# Patient Record
Sex: Female | Born: 1980 | Race: White | Hispanic: No | Marital: Single | Smoking: Current every day smoker
Health system: Southern US, Community
[De-identification: ages and names within clinical notes are randomized; demographics above are authoritative.]

## PROBLEM LIST (undated history)

## (undated) ENCOUNTER — Inpatient Hospital Stay (HOSPITAL_COMMUNITY): Payer: Self-pay

## (undated) DIAGNOSIS — F329 Major depressive disorder, single episode, unspecified: Secondary | ICD-10-CM

## (undated) DIAGNOSIS — Z789 Other specified health status: Secondary | ICD-10-CM

## (undated) DIAGNOSIS — F419 Anxiety disorder, unspecified: Secondary | ICD-10-CM

## (undated) DIAGNOSIS — K219 Gastro-esophageal reflux disease without esophagitis: Secondary | ICD-10-CM

## (undated) HISTORY — DX: Gastro-esophageal reflux disease without esophagitis: K21.9

## (undated) HISTORY — DX: Anxiety disorder, unspecified: F41.9

## (undated) HISTORY — DX: Major depressive disorder, single episode, unspecified: F32.9

## (undated) HISTORY — PX: FRACTURE SURGERY: SHX138

---

## 2000-01-18 ENCOUNTER — Other Ambulatory Visit: Admission: RE | Admit: 2000-01-18 | Discharge: 2000-01-18 | Payer: Self-pay | Admitting: Obstetrics and Gynecology

## 2002-05-25 ENCOUNTER — Emergency Department (HOSPITAL_COMMUNITY): Admission: EM | Admit: 2002-05-25 | Discharge: 2002-05-25 | Payer: Self-pay | Admitting: Emergency Medicine

## 2002-05-25 ENCOUNTER — Encounter: Payer: Self-pay | Admitting: Emergency Medicine

## 2011-11-15 ENCOUNTER — Encounter (HOSPITAL_COMMUNITY): Payer: Self-pay | Admitting: Emergency Medicine

## 2011-11-15 ENCOUNTER — Emergency Department (HOSPITAL_COMMUNITY)
Admission: EM | Admit: 2011-11-15 | Discharge: 2011-11-16 | Disposition: A | Discharge status: Home / Self Care | Payer: Self-pay | Attending: Emergency Medicine | Admitting: Emergency Medicine

## 2011-11-15 DIAGNOSIS — Z9109 Other allergy status, other than to drugs and biological substances: Secondary | ICD-10-CM | POA: Insufficient documentation

## 2011-11-15 DIAGNOSIS — N390 Urinary tract infection, site not specified: Secondary | ICD-10-CM | POA: Insufficient documentation

## 2011-11-15 DIAGNOSIS — F17200 Nicotine dependence, unspecified, uncomplicated: Secondary | ICD-10-CM | POA: Insufficient documentation

## 2011-11-15 DIAGNOSIS — R109 Unspecified abdominal pain: Secondary | ICD-10-CM | POA: Insufficient documentation

## 2011-11-15 NOTE — ED Notes (Signed)
[id number]<AV> Expected date:<BR> Expected time:<BR> Means of arrival:<BR> Comments:<BR> closing

## 2011-11-15 NOTE — ED Notes (Signed)
Pt with right flank pain since Friday which is now radiating to her left flank and right groin.  Pt reports urine is dark and has a strong odor.  Pt reports some nausea today but no vomiting.  Pt feels sweaty at times and is having chills.

## 2011-11-16 LAB — CBC
HCT: 38.8 % (ref 36.0–46.0)
Hemoglobin: 13.6 g/dL (ref 12.0–15.0)
MCH: 33.7 pg (ref 26.0–34.0)
MCHC: 35.1 g/dL (ref 30.0–36.0)
MCV: 96 fL (ref 78.0–100.0)
Platelets: 220 10*3/uL (ref 150–400)
RBC: 4.04 MIL/uL (ref 3.87–5.11)
RDW: 12.6 % (ref 11.5–15.5)
WBC: 12.7 10*3/uL — ABNORMAL HIGH (ref 4.0–10.5)

## 2011-11-16 LAB — URINALYSIS, ROUTINE W REFLEX MICROSCOPIC
Bilirubin Urine: NEGATIVE
Glucose, UA: NEGATIVE mg/dL
Nitrite: POSITIVE — AB
Protein, ur: 30 mg/dL — AB
Specific Gravity, Urine: 1.013 (ref 1.005–1.030)
Urobilinogen, UA: 0.2 mg/dL (ref 0.0–1.0)
pH: 6 (ref 5.0–8.0)

## 2011-11-16 LAB — BASIC METABOLIC PANEL
BUN: 8 mg/dL (ref 6–23)
CO2: 23 mEq/L (ref 19–32)
Calcium: 8.9 mg/dL (ref 8.4–10.5)
Chloride: 99 mEq/L (ref 96–112)
Creatinine, Ser: 0.68 mg/dL (ref 0.50–1.10)
GFR calc Af Amer: >90 mL/min (ref >90)
GFR calc non Af Amer: >90 mL/min (ref >90)
Glucose, Bld: 101 mg/dL — ABNORMAL HIGH (ref 70–99)
Potassium: 3.2 mEq/L — ABNORMAL LOW (ref 3.5–5.1)
Sodium: 133 mEq/L — ABNORMAL LOW (ref 135–145)

## 2011-11-16 LAB — URINE MICROSCOPIC-ADD ON

## 2011-11-16 LAB — PREGNANCY, URINE: Preg Test, Ur: NEGATIVE

## 2011-11-16 MED ORDER — CIPROFLOXACIN HCL 500 MG PO TABS: 500.0000 mg | ORAL_TABLET | Freq: Two times a day (BID) | ORAL | Status: AC

## 2011-11-16 MED ORDER — HYDROMORPHONE HCL PF 1 MG/ML IJ SOLN
1.0000 mg | Freq: Once | INTRAMUSCULAR | Status: AC
  Administered 2011-11-16: 1 mg via INTRAVENOUS
  Filled 2011-11-16: qty 1

## 2011-11-16 MED ORDER — ONDANSETRON HCL 4 MG/2ML IJ SOLN
4.0000 mg | Freq: Once | INTRAMUSCULAR | Status: AC
  Administered 2011-11-16: 4 mg via INTRAVENOUS
  Filled 2011-11-16: qty 2

## 2011-11-16 MED ORDER — POTASSIUM CHLORIDE CRYS ER 20 MEQ PO TBCR
40.0000 meq | EXTENDED_RELEASE_TABLET | Freq: Once | ORAL | Status: AC
  Administered 2011-11-16: 40 meq via ORAL
  Filled 2011-11-16: qty 2

## 2011-11-16 MED ORDER — CIPROFLOXACIN IN D5W 400 MG/200ML IV SOLN
400.0000 mg | Freq: Once | INTRAVENOUS | Status: AC
  Administered 2011-11-16: 400 mg via INTRAVENOUS
  Filled 2011-11-16: qty 200

## 2011-11-16 MED ORDER — SODIUM CHLORIDE 0.9 % IV BOLUS (SEPSIS)
1000.0000 mL | Freq: Once | INTRAVENOUS | Status: AC
  Administered 2011-11-16: 1000 mL via INTRAVENOUS

## 2011-11-16 MED ORDER — KETOROLAC TROMETHAMINE 30 MG/ML IJ SOLN
15.0000 mg | Freq: Once | INTRAMUSCULAR | Status: AC
  Administered 2011-11-16: 15 mg via INTRAVENOUS
  Filled 2011-11-16: qty 1

## 2011-11-16 MED ORDER — OXYCODONE-ACETAMINOPHEN 5-325 MG PO TABS: 1.0000 | ORAL_TABLET | ORAL | Status: AC | PRN

## 2011-11-16 NOTE — ED Notes (Signed)
Antibiotic finished.  MD made aware.

## 2011-11-16 NOTE — ED Notes (Signed)
Kohut, MD at bedside. 

## 2011-11-18 LAB — URINE CULTURE: Colony Count: >=100000

## 2011-11-19 NOTE — ED Notes (Signed)
+   Urine  Patient treated with cipro-sensitive to same-chart sent to EDP to office for review.

## 2011-11-20 NOTE — ED Provider Notes (Signed)
History    31yF with abdominal pain. Lower abdomen initially and now radiates into R flank and groin. Constant at rest and worse with movement. Subjective fever and chills. NO urinary complaints aside from seems darker and has strong odor. No dizziness or lightheadedness. Nausea. No vomiting. No hx of kidney stone or biliary colic.  CSN: [id number]  Arrival date & time 11/15/11  2210   First MD Initiated Contact with Patient 11/15/11 2228      Chief Complaint  Patient presents with  . Flank Pain    (Consider location/radiation/quality/duration/timing/severity/associated sxs/prior treatment) HPI  History reviewed. No pertinent past medical history.  Past Surgical History  Procedure Date  . Fracture surgery     No family history on file.  History  Substance Use Topics  . Smoking status: Current Every Day Smoker  . Smokeless tobacco: Not on file  . Alcohol Use: 3.0 oz/week    5 Cans of beer per week    OB History    Grav Para Term Preterm Abortions TAB SAB Ect Mult Living                  Review of Systems   Review of symptoms negative unless otherwise noted in HPI.   Allergies  Other  Home Medications   Current Outpatient Rx  Name Route Sig Dispense Refill  . ACETAMINOPHEN 325 MG PO TABS Oral Take 650 mg by mouth every 6 (six) hours as needed. For pain    . ACETAMINOPHEN 500 MG PO TABS Oral Take 1,000 mg by mouth every 6 (six) hours as needed. For pain    . CIPROFLOXACIN HCL 500 MG PO TABS Oral Take 1 tablet (500 mg total) by mouth every 12 (twelve) hours. 10 tablet 0  . OXYCODONE-ACETAMINOPHEN 5-325 MG PO TABS Oral Take 1 tablet by mouth every 4 (four) hours as needed for pain. 10 tablet 0    BP 110/82  Pulse 89  Temp 99.7 F (37.6 C) (Oral)  Resp 16  Wt 130 lb (58.968 kg)  SpO2 96%  LMP 11/09/2011  Physical Exam  Nursing note and vitals reviewed. Constitutional: She appears well-developed and well-nourished. No distress.  HENT:  Head:  Normocephalic and atraumatic.  Eyes: Conjunctivae normal are normal. Right eye exhibits no discharge. Left eye exhibits no discharge.  Neck: Neck supple.  Cardiovascular: Normal rate, regular rhythm and normal heart sounds.  Exam reveals no gallop and no friction rub.   No murmur heard. Pulmonary/Chest: Effort normal and breath sounds normal. No respiratory distress.  Abdominal: Soft. She exhibits no distension and no mass. There is tenderness. There is no rebound and no guarding.       Suprapubic tenderness.  Genitourinary:       No cva tenderness.  Musculoskeletal: She exhibits no edema and no tenderness.  Neurological: She is alert.  Skin: Skin is warm and dry.  Psychiatric: She has a normal mood and affect. Her behavior is normal. Thought content normal.    ED Course  Procedures (including critical care time)  Labs Reviewed  URINALYSIS, ROUTINE W REFLEX MICROSCOPIC - Abnormal; Notable for the following:    APPearance CLOUDY (*)     Hgb urine dipstick SMALL (*)     Ketones, ur TRACE (*)     Protein, ur 30 (*)     Nitrite POSITIVE (*)     Leukocytes, UA MODERATE (*)     All other components within normal limits  BASIC METABOLIC PANEL - Abnormal; Notable  for the following:    Sodium 133 (*)     Potassium 3.2 (*)     Glucose, Bld 101 (*)     All other components within normal limits  CBC - Abnormal; Notable for the following:    WBC 12.7 (*)     All other components within normal limits  URINE MICROSCOPIC-ADD ON - Abnormal; Notable for the following:    Squamous Epithelial / LPF FEW (*)     Bacteria, UA MANY (*)     All other components within normal limits  URINE CULTURE  PREGNANCY, URINE  LAB REPORT - SCANNED   No results found.   1. Abdominal pain   2. UTI (lower urinary tract infection)       MDM  31yF with abdominal pain. Likely 2/2 UTI which UA is indicative of. Dose of Abx given in ED. HD stable and handling PO. Feel that further tx can be done as outpt.  Return precautions discussed.        Raeford Razor, MD 11/20/11 [phone]

## 2011-11-28 ENCOUNTER — Encounter (HOSPITAL_COMMUNITY): Payer: Self-pay

## 2011-11-28 ENCOUNTER — Other Ambulatory Visit: Payer: Self-pay | Admitting: Plastic Surgery

## 2011-11-28 ENCOUNTER — Emergency Department (HOSPITAL_COMMUNITY): Payer: Self-pay | Admitting: Anesthesiology

## 2011-11-28 ENCOUNTER — Encounter (HOSPITAL_COMMUNITY): Payer: Self-pay | Admitting: Anesthesiology

## 2011-11-28 ENCOUNTER — Emergency Department (HOSPITAL_COMMUNITY)
Admission: EM | Admit: 2011-11-28 | Discharge: 2011-11-28 | Disposition: A | Discharge status: Home / Self Care | Payer: Self-pay | Attending: Emergency Medicine | Admitting: Emergency Medicine

## 2011-11-28 ENCOUNTER — Encounter (HOSPITAL_COMMUNITY): Admission: EM | Disposition: A | Discharge status: Home / Self Care | Payer: Self-pay | Source: Home / Self Care

## 2011-11-28 DIAGNOSIS — Y998 Other external cause status: Secondary | ICD-10-CM | POA: Insufficient documentation

## 2011-11-28 DIAGNOSIS — S0185XA Open bite of other part of head, initial encounter: Secondary | ICD-10-CM

## 2011-11-28 DIAGNOSIS — W540XXA Bitten by dog, initial encounter: Secondary | ICD-10-CM

## 2011-11-28 DIAGNOSIS — S0181XA Laceration without foreign body of other part of head, initial encounter: Secondary | ICD-10-CM

## 2011-11-28 DIAGNOSIS — S022XXB Fracture of nasal bones, initial encounter for open fracture: Secondary | ICD-10-CM | POA: Insufficient documentation

## 2011-11-28 DIAGNOSIS — T148XXA Other injury of unspecified body region, initial encounter: Secondary | ICD-10-CM

## 2011-11-28 DIAGNOSIS — Y92009 Unspecified place in unspecified non-institutional (private) residence as the place of occurrence of the external cause: Secondary | ICD-10-CM | POA: Insufficient documentation

## 2011-11-28 HISTORY — PX: LACERATION REPAIR: SHX5284

## 2011-11-28 SURGERY — REPAIR, LACERATION, 2 OR MORE
Anesthesia: General | Wound class: Clean Contaminated

## 2011-11-28 MED ORDER — LACTATED RINGERS IV SOLN
INTRAVENOUS | Status: DC | PRN
  Administered 2011-11-28 (×2): via INTRAVENOUS

## 2011-11-28 MED ORDER — HYDROCODONE-ACETAMINOPHEN 5-325 MG PO TABS: 1.0000 | ORAL_TABLET | Freq: Four times a day (QID) | ORAL | Status: DC | PRN

## 2011-11-28 MED ORDER — ONDANSETRON HCL 4 MG/2ML IJ SOLN
4.0000 mg | Freq: Once | INTRAMUSCULAR | Status: AC
  Administered 2011-11-28: 4 mg via INTRAVENOUS

## 2011-11-28 MED ORDER — HYDROMORPHONE HCL PF 1 MG/ML IJ SOLN
0.2500 mg | INTRAMUSCULAR | Status: DC | PRN
  Administered 2011-11-28 (×3): 0.5 mg via INTRAVENOUS

## 2011-11-28 MED ORDER — MIDAZOLAM HCL 5 MG/5ML IJ SOLN
INTRAMUSCULAR | Status: DC | PRN
  Administered 2011-11-28: 2 mg via INTRAVENOUS

## 2011-11-28 MED ORDER — BSS IO SOLN
INTRAOCULAR | Status: AC
  Filled 2011-11-28: qty 15

## 2011-11-28 MED ORDER — ONDANSETRON HCL 4 MG/2ML IJ SOLN
INTRAMUSCULAR | Status: AC
  Filled 2011-11-28: qty 2

## 2011-11-28 MED ORDER — LACTATED RINGERS IV SOLN: INTRAVENOUS | Status: DC

## 2011-11-28 MED ORDER — CEFAZOLIN SODIUM-DEXTROSE 2-3 GM-% IV SOLR
INTRAVENOUS | Status: AC
  Filled 2011-11-28: qty 50

## 2011-11-28 MED ORDER — SODIUM CHLORIDE 0.9 % IR SOLN
Status: DC | PRN
  Administered 2011-11-28: 11:00:00

## 2011-11-28 MED ORDER — PROMETHAZINE HCL 25 MG/ML IJ SOLN
6.2500 mg | INTRAMUSCULAR | Status: DC | PRN
  Administered 2011-11-28: 6.25 mg via INTRAVENOUS

## 2011-11-28 MED ORDER — PROMETHAZINE HCL 25 MG/ML IJ SOLN
INTRAMUSCULAR | Status: AC
  Filled 2011-11-28: qty 1

## 2011-11-28 MED ORDER — MORPHINE SULFATE 4 MG/ML IJ SOLN
4.0000 mg | Freq: Once | INTRAMUSCULAR | Status: AC
  Administered 2011-11-28: 4 mg via INTRAVENOUS
  Filled 2011-11-28: qty 1

## 2011-11-28 MED ORDER — MORPHINE SULFATE 4 MG/ML IJ SOLN
4.0000 mg | Freq: Once | INTRAMUSCULAR | Status: AC
  Administered 2011-11-28: 4 mg via INTRAVENOUS

## 2011-11-28 MED ORDER — CEFAZOLIN SODIUM-DEXTROSE 2-3 GM-% IV SOLR: 2.0000 g | INTRAVENOUS | Status: DC

## 2011-11-28 MED ORDER — DEXAMETHASONE SODIUM PHOSPHATE 4 MG/ML IJ SOLN
INTRAMUSCULAR | Status: DC | PRN
  Administered 2011-11-28: 8 mg via INTRAVENOUS

## 2011-11-28 MED ORDER — OXYMETAZOLINE HCL 0.05 % NA SOLN
NASAL | Status: AC
  Filled 2011-11-28: qty 15

## 2011-11-28 MED ORDER — AMOXICILLIN-POT CLAVULANATE 500-125 MG PO TABS: 1.0000 | ORAL_TABLET | Freq: Three times a day (TID) | ORAL | Status: DC

## 2011-11-28 MED ORDER — HYDROMORPHONE HCL PF 1 MG/ML IJ SOLN
INTRAMUSCULAR | Status: AC
  Filled 2011-11-28: qty 1

## 2011-11-28 MED ORDER — MIDAZOLAM HCL 2 MG/2ML IJ SOLN: 0.5000 mg | Freq: Once | INTRAMUSCULAR | Status: DC | PRN

## 2011-11-28 MED ORDER — HYDROMORPHONE HCL PF 1 MG/ML IJ SOLN
INTRAMUSCULAR | Status: AC
  Administered 2011-11-28: 0.5 mg via INTRAVENOUS
  Filled 2011-11-28: qty 1

## 2011-11-28 MED ORDER — MEPERIDINE HCL 25 MG/ML IJ SOLN: 6.2500 mg | INTRAMUSCULAR | Status: DC | PRN

## 2011-11-28 MED ORDER — FENTANYL CITRATE 0.05 MG/ML IJ SOLN
INTRAMUSCULAR | Status: DC | PRN
  Administered 2011-11-28 (×5): 50 ug via INTRAVENOUS

## 2011-11-28 MED ORDER — PROPOFOL 10 MG/ML IV BOLUS
INTRAVENOUS | Status: DC | PRN
  Administered 2011-11-28: 100 mg via INTRAVENOUS
  Administered 2011-11-28: 200 mg via INTRAVENOUS

## 2011-11-28 MED ORDER — PHENYLEPHRINE HCL 10 MG/ML IJ SOLN
INTRAMUSCULAR | Status: DC | PRN
  Administered 2011-11-28 (×3): 40 ug via INTRAVENOUS

## 2011-11-28 MED ORDER — SODIUM CHLORIDE 0.9 % IV SOLN
3.0000 g | Freq: Once | INTRAVENOUS | Status: AC
  Administered 2011-11-28: 3 g via INTRAVENOUS

## 2011-11-28 MED ORDER — OXYMETAZOLINE HCL 0.05 % NA SOLN
NASAL | Status: DC | PRN
  Administered 2011-11-28: 1 via NASAL

## 2011-11-28 MED ORDER — 0.9 % SODIUM CHLORIDE (POUR BTL) OPTIME
TOPICAL | Status: DC | PRN
  Administered 2011-11-28: 1000 mL

## 2011-11-28 MED ORDER — SUCCINYLCHOLINE CHLORIDE 20 MG/ML IJ SOLN
INTRAMUSCULAR | Status: DC | PRN
  Administered 2011-11-28: 100 mg via INTRAVENOUS

## 2011-11-28 SURGICAL SUPPLY — 55 items
ADH SKN CLS APL DERMABOND .7 (GAUZE/BANDAGES/DRESSINGS) ×2
AIRSTRIP 4 3/4X3 1/4 7185 (GAUZE/BANDAGES/DRESSINGS) IMPLANT
BANDAGE CONFORM 2  STR LF (GAUZE/BANDAGES/DRESSINGS) IMPLANT
BANDAGE GAUZE ELAST BULKY 4 IN (GAUZE/BANDAGES/DRESSINGS) IMPLANT
CANISTER SUCTION 2500CC (MISCELLANEOUS) IMPLANT
CATH ROBINSON RED A/P 16FR (CATHETERS) IMPLANT
CLEANER TIP ELECTROSURG 2X2 (MISCELLANEOUS) ×2 IMPLANT
CLOTH BEACON ORANGE TIMEOUT ST (SAFETY) ×2 IMPLANT
CLSR STERI-STRIP ANTIMIC 1/2X4 (GAUZE/BANDAGES/DRESSINGS) ×1 IMPLANT
CONT SPEC 4OZ CLIKSEAL STRL BL (MISCELLANEOUS) ×1 IMPLANT
COVER SURGICAL LIGHT HANDLE (MISCELLANEOUS) ×2 IMPLANT
DERMABOND ADVANCED (GAUZE/BANDAGES/DRESSINGS) ×2
DERMABOND ADVANCED .7 DNX12 (GAUZE/BANDAGES/DRESSINGS) IMPLANT
DRAIN PENROSE 1/4X12 LTX STRL (WOUND CARE) IMPLANT
DRSG EMULSION OIL 3X3 NADH (GAUZE/BANDAGES/DRESSINGS) IMPLANT
ELECT COATED BLADE 2.86 ST (ELECTRODE) ×1 IMPLANT
ELECT NDL TIP 2.8 STRL (NEEDLE) IMPLANT
ELECT NEEDLE TIP 2.8 STRL (NEEDLE) IMPLANT
ELECT REM PT RETURN 9FT ADLT (ELECTROSURGICAL) ×2
ELECTRODE REM PT RTRN 9FT ADLT (ELECTROSURGICAL) ×1 IMPLANT
GAUZE SPONGE 2X2 8PLY STRL LF (GAUZE/BANDAGES/DRESSINGS) IMPLANT
GAUZE SPONGE 4X4 16PLY XRAY LF (GAUZE/BANDAGES/DRESSINGS) IMPLANT
GLOVE BIO SURGEON STRL SZ 6.5 (GLOVE) ×2 IMPLANT
GOWN STRL NON-REIN LRG LVL3 (GOWN DISPOSABLE) ×4 IMPLANT
KIT BASIN OR (CUSTOM PROCEDURE TRAY) ×2 IMPLANT
KIT ROOM TURNOVER OR (KITS) ×2 IMPLANT
KIT SPLINT NASAL DENVER SM BEI (GAUZE/BANDAGES/DRESSINGS) ×2 IMPLANT
NDL 25GX 5/8IN NON SAFETY (NEEDLE) IMPLANT
NEEDLE 25GX 5/8IN NON SAFETY (NEEDLE) IMPLANT
NS IRRIG 1000ML POUR BTL (IV SOLUTION) ×2 IMPLANT
PAD ARMBOARD 7.5X6 YLW CONV (MISCELLANEOUS) ×4 IMPLANT
PATTIES SURGICAL .5 X1 (DISPOSABLE) ×1 IMPLANT
PENCIL BUTTON HOLSTER BLD 10FT (ELECTRODE) ×2 IMPLANT
SPLINT NASAL DOYLE BI-VL (GAUZE/BANDAGES/DRESSINGS) ×2 IMPLANT
SPONGE GAUZE 2X2 STER 10/PKG (GAUZE/BANDAGES/DRESSINGS) ×1
SPONGE GAUZE 4X4 12PLY (GAUZE/BANDAGES/DRESSINGS) IMPLANT
SUT CHROMIC 4 0 P 3 18 (SUTURE) ×2 IMPLANT
SUT ETHILON 3 0 PS 1 (SUTURE) ×1 IMPLANT
SUT ETHILON 4 0 PS 2 18 (SUTURE) ×2 IMPLANT
SUT ETHILON 5 0 P 3 18 (SUTURE) ×1
SUT MON AB 5-0 P3 18 (SUTURE) ×3 IMPLANT
SUT NYLON ETHILON 5-0 P-3 1X18 (SUTURE) ×1 IMPLANT
SUT SILK 4 0 (SUTURE) ×2
SUT SILK 4-0 18XBRD TIE 12 (SUTURE) ×1 IMPLANT
SUT VIC AB 5-0 P-3 18XBRD (SUTURE) IMPLANT
SUT VIC AB 5-0 P3 18 (SUTURE) ×4
SWAB COLLECTION DEVICE MRSA (MISCELLANEOUS) IMPLANT
SYR BULB IRRIGATION 50ML (SYRINGE) ×1 IMPLANT
SYR TB 1ML LUER SLIP (SYRINGE) IMPLANT
TOWEL OR 17X24 6PK STRL BLUE (TOWEL DISPOSABLE) ×2 IMPLANT
TOWEL OR 17X26 10 PK STRL BLUE (TOWEL DISPOSABLE) ×2 IMPLANT
TRAY ENT MC OR (CUSTOM PROCEDURE TRAY) ×2 IMPLANT
TUBE ANAEROBIC SPECIMEN COL (MISCELLANEOUS) IMPLANT
WATER STERILE IRR 1000ML POUR (IV SOLUTION) ×2 IMPLANT
YANKAUER SUCT BULB TIP NO VENT (SUCTIONS) ×1 IMPLANT

## 2011-11-28 NOTE — Anesthesia Preprocedure Evaluation (Addendum)
Anesthesia Evaluation  Patient identified by MRN, date of birth, ID band Patient awake    Reviewed: Allergy & Precautions, H&P , NPO status , Patient's Chart, lab work & pertinent test results  History of Anesthesia Complications Negative for: history of anesthetic complications  Airway Mallampati: I TM Distance: >3 FB Neck ROM: Full    Dental  (+) Caps, Dental Advisory Given and Teeth Intact,    Pulmonary Current Smoker,  breath sounds clear to auscultation  Pulmonary exam normal       Cardiovascular negative cardio ROS  Rhythm:Regular Rate:Normal     Neuro/Psych negative neurological ROS  negative psych ROS   GI/Hepatic Neg liver ROS, GERD-  Poorly Controlled and Medicated,(+)     substance abuse (4 beers 2:30am)  alcohol use,   Endo/Other  negative endocrine ROS  Renal/GU negative Renal ROS     Musculoskeletal   Abdominal   Peds  Hematology   Anesthesia Other Findings   Reproductive/Obstetrics                         Anesthesia Physical Anesthesia Plan  ASA: II and Emergent  Anesthesia Plan: General   Post-op Pain Management:    Induction: Intravenous, Rapid sequence and Cricoid pressure planned  Airway Management Planned: Oral ETT  Additional Equipment:   Intra-op Plan:   Post-operative Plan: Extubation in OR  Informed Consent: I have reviewed the patients History and Physical, chart, labs and discussed the procedure including the risks, benefits and alternatives for the proposed anesthesia with the patient or authorized representative who has indicated his/her understanding and acceptance.   Dental advisory given  Plan Discussed with: CRNA, Anesthesiologist and Surgeon  Anesthesia Plan Comments: (Plan routine monitors, GETA with RSI)        Anesthesia Quick Evaluation

## 2011-11-28 NOTE — Brief Op Note (Signed)
11/28/2011  11:09 AM  PATIENT:  Meghan [name]  31 y.o. female  PRE-OPERATIVE DIAGNOSIS:  dog bite  POST-OPERATIVE DIAGNOSIS:  Dog Bite  PROCEDURE:  Procedure(s) (LRB) with comments: REPAIR MULTIPLE LACERATIONS (N/A)  SURGEON:  Surgeon(s) and Role:    * Claire Sanger, DO - Primary  PHYSICIAN ASSISTANT:   ASSISTANTS: none   ANESTHESIA:   general  EBL:  Total I/O In: 1000 [I.V.:1000] Out: -   BLOOD ADMINISTERED:none  DRAINS: none   LOCAL MEDICATIONS USED:  NONE  SPECIMEN:  No Specimen  DISPOSITION OF SPECIMEN:  N/A  COUNTS:  YES  TOURNIQUET:  * No tourniquets in log *  DICTATION: dictated  PLAN OF CARE: Discharge to home after PACU  PATIENT DISPOSITION:  PACU - hemodynamically stable.   Delay start of Pharmacological VTE agent (>24hrs) due to surgical blood loss or risk of bleeding: no

## 2011-11-28 NOTE — ED Provider Notes (Signed)
History     CSN: [id number]  Arrival date & time 11/28/11  0316   None     No chief complaint on file.   (Consider location/radiation/quality/duration/timing/severity/associated sxs/prior treatment) Patient is a 31 y.o. female presenting with animal bite. The history is provided by the patient.  Animal Bite  The incident occurred just prior to arrival (PT bit on nose by pitbull, neighbors dog, immune status unknown). The incident occurred at home. There is an injury to the nose. Her tetanus status is UTD. She has been behaving normally. Recently, medical care has been given at this facility.    No past medical history on file.  Past Surgical History  Procedure Date  . Fracture surgery     No family history on file.  History  Substance Use Topics  . Smoking status: Current Every Day Smoker  . Smokeless tobacco: Not on file  . Alcohol Use: 3.0 oz/week    5 Cans of beer per week    OB History    Grav Para Term Preterm Abortions TAB SAB Ect Mult Living                  Review of Systems  Skin: Positive for wound.  All other systems reviewed and are negative.    Allergies  Other  Home Medications   Current Outpatient Rx  Name Route Sig Dispense Refill  . ACETAMINOPHEN 325 MG PO TABS Oral Take 650 mg by mouth every 6 (six) hours as needed. For pain    . ACETAMINOPHEN 500 MG PO TABS Oral Take 1,000 mg by mouth every 6 (six) hours as needed. For pain      LMP 11/09/2011  Physical Exam  Constitutional: She is oriented to person, place, and time. She appears well-developed and well-nourished.  HENT:  Head: Normocephalic.       3x2 cm laceration to bridge of nose with 1cm deficit of tissue  Eyes: Conjunctivae normal and EOM are normal. Pupils are equal, round, and reactive to light.  Neck: Normal range of motion.  Cardiovascular: Normal rate, regular rhythm and normal heart sounds.   Pulmonary/Chest: Effort normal and breath sounds normal.  Abdominal: Soft.  Bowel sounds are normal.  Musculoskeletal: Normal range of motion.  Neurological: She is alert and oriented to person, place, and time.  Skin: Skin is warm and dry.  Psychiatric: She has a normal mood and affect. Her behavior is normal.    ED Course  Procedures (including critical care time)  Labs Reviewed - No data to display No results found.   No diagnosis found.    MDM  Discussed with ent, shoemaker,  In ed to personally evaluate pt.  States needs flap because of tissue loss.  He discussed with [facility].  Awaiting dispo from [facility] vs [facility].        Katriel Cutsforth Lytle Michaels, MD 11/28/11 [phone]

## 2011-11-28 NOTE — Consult Note (Signed)
Reason for Consult:dog bite Referring Physician: Dr. Berniece [name] is an 31 y.o. female.  HPI: The patient is a 31 yrs old wf in the ED for evaluation of a dog bite to her face.  She stated that the incident occurred at 3 am and she was brought directly to the [facility] ED.  ENT was called to evaluate the situation.  It was thought that there was a significant amount of tissue missing on the dorsal aspect of the nose.  She denies any significant medical conditions.  She is a smoker.  No past medical history on file.  Past Surgical History  Procedure Date  . Fracture surgery     No family history on file.  Social History:  reports that she has been smoking Cigarettes.  She has been smoking about 1 pack per day. She does not have any smokeless tobacco history on file. She reports that she drinks alcohol. She reports that she does not use illicit drugs.  Allergies:  Allergies  Allergen Reactions  . Shellfish Allergy Swelling    Facial swelling    Medications: I have reviewed the patient's current medications.  No results found for this or any previous visit (from the past 48 hour(s)).  No results found.  Review of Systems  Constitutional: Negative.   HENT: Negative.   Eyes: Negative.   Respiratory: Negative.   Cardiovascular: Negative.   Gastrointestinal: Negative.   Genitourinary: Negative.   Musculoskeletal: Negative.   Skin: Negative.   Neurological: Negative.   Endo/Heme/Allergies: Negative.   Psychiatric/Behavioral: Negative.    Blood pressure 112/75, pulse 90, temperature 98.6 F (37 C), temperature source Oral, resp. rate 16, weight 58.968 kg (130 lb), last menstrual period 11/09/2011, SpO2 100.00%. Physical Exam  Constitutional: She appears well-developed and well-nourished.  HENT:  Head: Normocephalic.  Nose:    Eyes: Conjunctivae normal and EOM are normal. Pupils are equal, round, and reactive to light.  Neck: Normal range of motion.    Cardiovascular: Normal rate.   Respiratory: Effort normal. No respiratory distress. She has no wheezes.  Neurological: She is alert.  Skin: Skin is warm.  Psychiatric: She has a normal mood and affect. Her behavior is normal. Judgment and thought content normal.    Assessment/Plan: Dog bite to nose. Plan OR for irrigation and debridement with repair of the bite.  Risks and complications were reviewed and include bleeding, pain, scar, and need for additional surgery.  [name],Meghan [name] 11/28/2011, 9:13 AM

## 2011-11-28 NOTE — ED Notes (Signed)
Dr Kelly Splinter at bedside, informed pt that procedure would be at [facility] but unaware of time. Pt remaining NPO.

## 2011-11-28 NOTE — Preoperative (Signed)
Beta Blockers   Reason not to administer Beta Blockers:Not Applicable 

## 2011-11-28 NOTE — ED Notes (Signed)
Spoke with Dr Annalee Genta who stated Dr Kelly Splinter would be here this morning after procedure to assess pt and then plan of care would be established. Pt and bf notified of update

## 2011-11-28 NOTE — Anesthesia Postprocedure Evaluation (Signed)
  Anesthesia Post-op Note  Patient: Meghan [name]  Procedure(s) Performed: Procedure(s) (LRB) with comments: REPAIR MULTIPLE LACERATIONS (N/A)  Patient Location: PACU  Anesthesia Type: General  Level of Consciousness: awake, alert  and patient cooperative  Airway and Oxygen Therapy: Patient Spontanous Breathing  Post-op Pain: none  Post-op Assessment: Post-op Vital signs reviewed, Patient's Cardiovascular Status Stable, Respiratory Function Stable, Patent Airway, No signs of Nausea or vomiting and Pain level controlled  Post-op Vital Signs: Reviewed and stable  Complications: No apparent anesthesia complications

## 2011-11-28 NOTE — ED Notes (Signed)
Pt assisted back in bed from leaving room to smoke with previous RN unaware. Pt still with ETOH on board, with slurred speech and unsteady gait. Pt assisted with removal of clothing and into a gown. Awaiting ENT MD

## 2011-11-28 NOTE — H&P (Signed)
Meghan [name] is an 31 y.o. female.   Chief Complaint: dog bite HPI: The patient is a 3 yrs old wf with a dog bite to her nose.  It occurred at 3 am this morning and she was brought to the [facility] ED for evaluation.  The dog is not known and not in custody.  Her tetanus is up to date (2010).  There is significant abrasion to the dorsal aspect of the nose.   No past medical history on file.  Past Surgical History  Procedure Date  . Fracture surgery     No family history on file. Social History:  reports that she has been smoking Cigarettes.  She has been smoking about 1 pack per day. She does not have any smokeless tobacco history on file. She reports that she drinks alcohol. She reports that she does not use illicit drugs.  Allergies:  Allergies  Allergen Reactions  . Shellfish Allergy Swelling    Facial swelling     (Not in a hospital admission)  No results found for this or any previous visit (from the past 48 hour(s)). No results found.  Review of Systems  Constitutional: Negative.   HENT: Negative.   Eyes: Negative.   Respiratory: Negative.   Cardiovascular: Negative.   Gastrointestinal: Negative.   Genitourinary: Negative.   Musculoskeletal: Negative.   Skin: Negative.   Neurological: Negative.     Last menstrual period 11/09/2011. Physical Exam  Constitutional: She appears well-developed and well-nourished.  HENT:  Nose:    Eyes: Conjunctivae normal and EOM are normal. Pupils are equal, round, and reactive to light.  Cardiovascular: Normal rate.   Respiratory: Effort normal.  GI: Soft.  Musculoskeletal: Normal range of motion.  Neurological: She is alert.  Skin: Skin is warm.  Psychiatric: She has a normal mood and affect. Her behavior is normal. Judgment normal.     Assessment/Plan Dog bite to nose - OR for repair  [facility] 11/28/2011, 9:20 AM

## 2011-11-28 NOTE — Transfer of Care (Signed)
Immediate Anesthesia Transfer of Care Note  Patient: Meghan [name]  Procedure(s) Performed: Procedure(s) (LRB) with comments: REPAIR MULTIPLE LACERATIONS (N/A)  Patient Location: PACU  Anesthesia Type: General  Level of Consciousness: awake, alert  and oriented  Airway & Oxygen Therapy: Patient Spontanous Breathing  Post-op Assessment: Report given to PACU RN and Post -op Vital signs reviewed and stable  Post vital signs: Reviewed and stable  Complications: No apparent anesthesia complications and Patient re-intubated

## 2011-11-28 NOTE — Consult Note (Signed)
ENT CONSULT:  Reason for Consult:Facial Injury Referring Physician: EDPN  Meghan [name] is an 31 y.o. female.  HPI: Pt adm this am for evaluation of facial injury. Reports dog bite to face. Nl vision, no LOC  No past medical history on file.  Past Surgical History  Procedure Date  . Fracture surgery     No family history on file.  Social History:  reports that she has been smoking Cigarettes.  She has been smoking about 1 pack per day. She does not have any smokeless tobacco history on file. She reports that she drinks alcohol. She reports that she does not use illicit drugs.  Allergies:  Allergies  Allergen Reactions  . Shellfish Allergy Swelling    Facial swelling    Medications: I have reviewed the patient's current medications.  No results found for this or any previous visit (from the past 48 hour(s)).  No results found.  ROS:Per ER adm paperwork   Blood pressure 136/98, temperature 98.6 F (37 C), temperature source Oral, weight 58.968 kg (130 lb), last menstrual period 11/09/2011, SpO2 96.00%.  PHYSICAL EXAM: General appearance - oriented to person, place, and time and anxious Eyes - pupils equal and reactive, extraocular eye movements intact, left eye normal, right eye normal Nose - Midline septum and [place] patent, nasal ala intact  2 x 3 cm avulsion injury of nasal dorsum, exposed nasal bones and upper lat cart.  Sig soft tissue loss, no active bleeding Mouth - mucous membranes moist, pharynx normal without lesions  Studies Reviewed:None  Assessment/Plan: Pt with extensive acute avulsion injury to the nasal dorsal soft tissue after dog bite. Pt will require soft tissue reconstruction with possible flap for adequate closure and best cosmesis. Discussed with Dr. Kelly Splinter who will evaluate and treat pt this am.  Meghan [name] 11/28/2011, 8:18 AM

## 2011-11-28 NOTE — ED Notes (Signed)
Pt transported to OR and report given to [employer]

## 2011-11-28 NOTE — ED Notes (Signed)
The incident occurred just prior to arrival (pt bitten on face by pitbull). There is an injury to the nose. There have been no prior injuries to these areas. Her tetanus status is UTD.

## 2011-11-28 NOTE — ED Notes (Signed)
Clothing, purse silver watch and belly button ring given to pt bf Clay.

## 2011-11-28 NOTE — ED Notes (Signed)
Just went to pt's room for hourly rounding. Pt's sister states that pt just went outside to smoke.

## 2011-11-28 NOTE — Progress Notes (Signed)
Pt continues to be very sleepy.  Arousable to loud voice, RR 8-10.  Dr. Jean Rosenthal aware.

## 2011-11-28 NOTE — ED Notes (Signed)
Found pt and bf walking in from outside. Not sure why pt was outside????

## 2011-11-29 ENCOUNTER — Encounter (HOSPITAL_COMMUNITY): Payer: Self-pay | Admitting: Plastic Surgery

## 2011-11-29 NOTE — Addendum Note (Signed)
Addendum  created 11/29/11 1814 by Yonis Carreon J Claudett Bayly, CRNA   Modules edited:Anesthesia Medication Administration    

## 2011-11-29 NOTE — Addendum Note (Signed)
Addendum  created 11/29/11 1814 by Jeani Hawking, CRNA   Modules edited:Anesthesia Medication Administration

## 2011-11-30 NOTE — Op Note (Signed)
NAMERUDELL, MARLOWE NO.:  [id number]  MEDICAL RECORD NO.:  [id number]  LOCATION:  [facility]                         FACILITY:  [facility]  PHYSICIAN:  Wayland Denis, DO      DATE OF BIRTH:  08-15-1980  DATE OF PROCEDURE:  11/28/2011 DATE OF DISCHARGE:  11/28/2011                              OPERATIVE REPORT   PREOPERATIVE DIAGNOSIS:  Dog bite to the nose.  POSTOPERATIVE DIAGNOSIS:  Dog bite to the nose.  PROCEDURE:  Irrigation and debridement of debris and nonviable tissue and nose skin; primary repair of cartilage and skin with internal and external packing.  ATTENDING SURGEON:  Wayland Denis, DO.  ANESTHESIA:  General.  INDICATIONS FOR PROCEDURE:  The patient is a 31 year old female who was allegedly bitten by a dog, a pit bull type, directly prior to coming into the emergency room, which was about 2:33 o'clock in the morning. No more details given.  The owner of the dog is unknown, and the location of the dog is unknown.  Her tetanus is up-to-date.  She had a stellate laceration on the superior dorsal aspect of the nose that included the glabellar area as well.  It was gaping and had jagged edges, it included the soft tissue, bone, and cartilage as well as the inner mucosa.  Risks and complications were reviewed which included bleeding, pain, scar, infection, higher risk with infection with closure, and likely need for repeat operation.  The patient wished to proceed.  Consent was signed.  DESCRIPTION OF PROCEDURE:  The patient was taken to the operating room and placed on the operating room table in a supine position.  General anesthesia was administered.  Once adequate, a time-out was called and all information was confirmed to be correct.  She was prepped and draped in the usual sterile fashion.  The stellate crushed edges of the skin were excised with a #10 blade.  The cartilage and the nasal mucosa was repaired with a combination of chromic and Vicryl  sutures on both sides, the left and the right.  Once that was done, loose bone fragments were removed.  There was a large bone fragment that was placed back over the dorsum and sutures were placed in the soft tissue above it.  The decision was made not to place hardware due to the very, very poor soft tissue envelope.  The skin edges were then reapproximated loosely with a combination of 6-0 Prolene and 5-0 Monocryl. Inner splints were placed and tacked in with a 3-0 silk with a knot on the right.  Steri-Strips and a splint was placed on the nose.  The patient tolerated the procedure well.  There were no complications.  She was awakened and taken to recovery room in stable condition.     Wayland Denis, DO     CS/MEDQ  D:  11/29/2011  T:  11/30/2011  Job:  [id number]

## 2012-08-27 ENCOUNTER — Inpatient Hospital Stay (HOSPITAL_COMMUNITY)
Admission: AD | Admit: 2012-08-27 | Discharge: 2012-08-27 | Disposition: A | Discharge status: Home / Self Care | Payer: Medicaid Other | Source: Ambulatory Visit | Attending: Obstetrics and Gynecology | Admitting: Obstetrics and Gynecology

## 2012-08-27 ENCOUNTER — Encounter (HOSPITAL_COMMUNITY): Payer: Self-pay | Admitting: Advanced Practice Midwife

## 2012-08-27 ENCOUNTER — Inpatient Hospital Stay (HOSPITAL_COMMUNITY): Payer: Medicaid Other

## 2012-08-27 DIAGNOSIS — N930 Postcoital and contact bleeding: Secondary | ICD-10-CM

## 2012-08-27 DIAGNOSIS — R109 Unspecified abdominal pain: Secondary | ICD-10-CM | POA: Insufficient documentation

## 2012-08-27 DIAGNOSIS — O209 Hemorrhage in early pregnancy, unspecified: Secondary | ICD-10-CM | POA: Insufficient documentation

## 2012-08-27 DIAGNOSIS — R51 Headache: Secondary | ICD-10-CM | POA: Insufficient documentation

## 2012-08-27 HISTORY — DX: Other specified health status: Z78.9

## 2012-08-27 LAB — URINALYSIS, ROUTINE W REFLEX MICROSCOPIC
Ketones, ur: NEGATIVE mg/dL
Leukocytes, UA: NEGATIVE
Nitrite: NEGATIVE
Specific Gravity, Urine: 1.02 (ref 1.005–1.030)
pH: 8 (ref 5.0–8.0)

## 2012-08-27 LAB — CBC
HCT: 37.7 % (ref 36.0–46.0)
MCHC: 34.5 g/dL (ref 30.0–36.0)
RDW: 11.9 % (ref 11.5–15.5)

## 2012-08-27 LAB — ABO/RH: ABO/RH(D): A POS

## 2012-08-27 LAB — WET PREP, GENITAL

## 2012-08-27 LAB — HCG, QUANTITATIVE, PREGNANCY: hCG, Beta Chain, Quant, S: 57533 m[IU]/mL — ABNORMAL HIGH (ref <5)

## 2012-08-27 NOTE — Progress Notes (Signed)
Pt states she is feeling some cramping that she rates a 1 and doesn't know if it is because she is hungry.

## 2012-08-27 NOTE — MAU Provider Note (Signed)
Chief Complaint: Vaginal Bleeding   First Provider Initiated Contact with Patient 08/27/12 2240     SUBJECTIVE HPI: Meghan [name] is a 32 y.o. G2P0010 at 100w5d by LMP who presents with + HPT last week, light bleeding and cramping since intercourse last night. Denies fever, chills, passage of tissue, vaginal discharge, urinary complaints, GI complaints. Has not had any testing at this pregnancy. Plans to get prenatal care at [facility].   Past Medical History  Diagnosis Date  . Medical history non-contributory    OB History   Grav Para Term Preterm Abortions TAB SAB Ect Mult Living   2    1          # Outc Date GA Lbr Len/2nd Wgt Sex Del Anes PTL Lv   1 ABT            2 CUR              Past Surgical History  Procedure Laterality Date  . Fracture surgery    . Laceration repair  11/28/2011    Procedure: REPAIR MULTIPLE LACERATIONS;  Surgeon: Wayland Denis, DO;  Location: [facility] OR;  Service: Plastics;  Laterality: N/A;   History   Social History  . Marital Status: Single    Spouse Name: N/A    Number of Children: N/A  . Years of Education: N/A   Occupational History  . Not on file.   Social History Main Topics  . Smoking status: Current Every Day Smoker -- 1.00 packs/day    Types: Cigarettes  . Smokeless tobacco: Not on file  . Alcohol Use: 0.0 oz/week     Comment: occassional  . Drug Use: No  . Sexually Active: Yes   Other Topics Concern  . Not on file   Social History Narrative  . No narrative on file   No current facility-administered medications on file prior to encounter.   No current outpatient prescriptions on file prior to encounter.   Allergies  Allergen Reactions  . Shellfish Allergy Swelling    Facial swelling    ROS: Pertinent items in HPI  OBJECTIVE Blood pressure 114/66, pulse 84, temperature 98.7 F (37.1 C), temperature source Oral, resp. rate 18, height 5\' 7"  (1.702 m), weight 60.328 kg (133 lb), last menstrual period 07/04/2012. GENERAL:  Well-developed, well-nourished female in no acute distress.  HEENT: Normocephalic HEART: normal rate RESP: normal effort ABDOMEN: Soft, non-tender EXTREMITIES: Nontender, no edema NEURO: Alert and oriented SPECULUM EXAM: NEFG, moderate amount of creamy, pink, mildly malodorous discharge. cervix clean BIMANUAL: cervix close; uterus slightly enlarged, no adnexal tenderness or masses. No CMT.  LAB RESULTS Results for orders placed during the hospital encounter of 08/27/12 (from the past 24 hour(s))  URINALYSIS, ROUTINE W REFLEX MICROSCOPIC     Status: None   Collection Time    08/27/12  7:25 PM      Result Value Range   Color, Urine YELLOW  YELLOW   APPearance CLEAR  CLEAR   Specific Gravity, Urine 1.020  1.005 - 1.030   pH 8.0  5.0 - 8.0   Glucose, UA NEGATIVE  NEGATIVE mg/dL   Hgb urine dipstick NEGATIVE  NEGATIVE   Bilirubin Urine NEGATIVE  NEGATIVE   Ketones, ur NEGATIVE  NEGATIVE mg/dL   Protein, ur NEGATIVE  NEGATIVE mg/dL   Urobilinogen, UA 0.2  0.0 - 1.0 mg/dL   Nitrite NEGATIVE  NEGATIVE   Leukocytes, UA NEGATIVE  NEGATIVE  POCT PREGNANCY, URINE     Status: Abnormal  Collection Time    08/27/12  7:54 PM      Result Value Range   Preg Test, Ur POSITIVE (*) NEGATIVE  HCG, QUANTITATIVE, PREGNANCY     Status: Abnormal   Collection Time    08/27/12  9:15 PM      Result Value Range   hCG, Beta Nyra Jabs, [place] [address] (*) <5 mIU/mL  ABO/RH     Status: None   Collection Time    08/27/12  9:15 PM      Result Value Range   ABO/RH(D) A POS    CBC     Status: Abnormal   Collection Time    08/27/12  9:15 PM      Result Value Range   WBC 10.3  4.0 - 10.5 K/uL   RBC 3.85 (*) 3.87 - 5.11 MIL/uL   Hemoglobin 13.0  12.0 - 15.0 g/dL   HCT 91.4  78.2 - 95.6 %   MCV 97.9  78.0 - 100.0 fL   MCH 33.8  26.0 - 34.0 pg   MCHC 34.5  30.0 - 36.0 g/dL   RDW 21.3  08.6 - 57.8 %   Platelets 224  150 - 400 K/uL  WET PREP, GENITAL     Status: Abnormal   Collection Time    08/27/12 10:53  PM      Result Value Range   Yeast Wet Prep HPF POC NONE SEEN  NONE SEEN   Trich, Wet Prep NONE SEEN  NONE SEEN   Clue Cells Wet Prep HPF POC FEW (*) NONE SEEN   WBC, Wet Prep HPF POC FEW (*) NONE SEEN    IMAGING US Ob Comp Less 14 Wks  08/27/2012   *RADIOLOGY REPORT*  Clinical Data: cramping and spotting,  OBSTETRIC <14 WK [place]  Technique: Transabdominal ultrasound examination was performed for complete evaluation of the gestation as well as the maternal uterus, adnexal regions, and pelvic cul-de-sac.  Comparison: None.  Findings: There is a single intrauterine gestation.  Crown-rump length is 1.63 cm for an estimated gestational age of [redacted] weeks 1 day.  Fetal heart rate 174 beats per minute.  No subchorionic hemorrhage.  Ovaries are symmetric in size and echotexture.  No adnexal masses. No free fluid.  IMPRESSION: 8-week-1-day intrauterine pregnancy.  Fetal heart rate 174 beats per minute.   Original Report Authenticated By: Charlett Nose, M.D.    MAU COURSE  ASSESSMENT 1. First trimester bleeding   2. PCB (post coital bleeding)     PLAN Discharge home in stable condition. Pelvic rest. List of providers in pregnancy verification letter given. GC/Chlamydia cultures pending. Follow-up Information   Follow up with [facility].      Follow up with [facility] [place] [facility]. (As needed if symptoms worsen)    Contact information:   [address] [id number] [place] [address] [phone]       Medication List    STOP taking these medications       PAMPRIN MAX 250-250-65 MG per tablet  Generic drug:  aspirin-acetaminophen-caffeine      TAKE these medications       calcium carbonate 500 MG chewable tablet  Commonly known as:  TUMS - dosed in mg elemental calcium  Chew 2 tablets by mouth daily as needed for heartburn.     prenatal multivitamin Tabs  Take 1 tablet by mouth every evening. At [employer],  [place] 08/27/2012  11:31  PM

## 2012-08-27 NOTE — MAU Note (Signed)
Pt took HPT last week and it was positive. Had some bleeding last night after intercourse and still having mild cramping and spotting today.

## 2012-08-27 NOTE — Progress Notes (Signed)
Pt states she has been having"minor headache for the past few days"

## 2012-08-27 NOTE — MAU Note (Signed)
Pt states she started having vaginal bleeding last night and then it stopped and then some spotting "pink"

## 2012-08-28 LAB — GC/CHLAMYDIA PROBE AMP: GC Probe RNA: NEGATIVE

## 2012-08-31 NOTE — MAU Provider Note (Signed)
Attestation of Attending Supervision of Advanced Practitioner: Evaluation and management procedures were performed by the PA/NP/CNM/OB Fellow under my supervision/collaboration. Chart reviewed and agree with management and plan.  Sanam Marmo V 08/31/2012 5:50 PM

## 2012-10-15 ENCOUNTER — Encounter: Payer: Self-pay | Admitting: Obstetrics

## 2012-10-15 ENCOUNTER — Ambulatory Visit (INDEPENDENT_AMBULATORY_CARE_PROVIDER_SITE_OTHER): Payer: Medicaid Other | Admitting: Obstetrics

## 2012-10-15 VITALS — BP 110/68 | Temp 98.7°F | Wt 134.0 lb

## 2012-10-15 DIAGNOSIS — Z3201 Encounter for pregnancy test, result positive: Secondary | ICD-10-CM

## 2012-10-15 DIAGNOSIS — J309 Allergic rhinitis, unspecified: Secondary | ICD-10-CM

## 2012-10-15 DIAGNOSIS — Z3401 Encounter for supervision of normal first pregnancy, first trimester: Secondary | ICD-10-CM

## 2012-10-15 DIAGNOSIS — R51 Headache: Secondary | ICD-10-CM

## 2012-10-15 DIAGNOSIS — K219 Gastro-esophageal reflux disease without esophagitis: Secondary | ICD-10-CM

## 2012-10-15 DIAGNOSIS — J302 Other seasonal allergic rhinitis: Secondary | ICD-10-CM

## 2012-10-15 LAB — OB RESULTS CONSOLE GC/CHLAMYDIA
CHLAMYDIA, DNA PROBE: NEGATIVE
Gonorrhea: NEGATIVE

## 2012-10-15 LAB — POCT URINALYSIS DIPSTICK
Bilirubin, UA: NEGATIVE
Glucose, UA: NEGATIVE
Leukocytes, UA: NEGATIVE
Nitrite, UA: NEGATIVE

## 2012-10-15 MED ORDER — BUTALBITAL-APAP-CAFFEINE 50-325-40 MG PO TABS: 1.0000 | ORAL_TABLET | Freq: Four times a day (QID) | ORAL | Status: DC | PRN

## 2012-10-15 MED ORDER — LORATADINE 10 MG PO TABS: 10.0000 mg | ORAL_TABLET | Freq: Every day | ORAL | Status: DC

## 2012-10-15 MED ORDER — OMEPRAZOLE 20 MG PO CPDR: 20.0000 mg | DELAYED_RELEASE_CAPSULE | Freq: Every day | ORAL | Status: DC

## 2012-10-15 MED ORDER — BUTALBITAL-APAP-CAFFEINE 50-325-40 MG PO TABS: 2.0000 | ORAL_TABLET | Freq: Two times a day (BID) | ORAL | Status: DC | PRN

## 2012-10-15 MED ORDER — OMEPRAZOLE 20 MG PO CPDR: 20.0000 mg | DELAYED_RELEASE_CAPSULE | Freq: Two times a day (BID) | ORAL | Status: DC

## 2012-10-15 NOTE — Progress Notes (Signed)
Pulse-105   Subjective:    Meghan [name] is being seen today for her first obstetrical visit.  This is a planned pregnancy. She is at [redacted]w[redacted]d gestation. Her obstetrical history is significant for smoker. Relationship with FOB: significant other, living together. Patient does intend to breast feed. Pregnancy history fully reviewed.  Menstrual History: OB History   Grav Para Term Preterm Abortions TAB SAB Ect Mult Living   2    1           Menarche age: 32  Patient's last menstrual period was 07/04/2012.    The following portions of the patient's history were reviewed and updated as appropriate: allergies, current medications, past family history, past medical history, past social history, past surgical history and problem list.  Review of Systems Pertinent items are noted in HPI.    Objective:    General appearance: alert and no distress Abdomen: normal findings: soft, non-tender Pelvic: cervix normal in appearance, external genitalia normal, no adnexal masses or tenderness, no cervical motion tenderness, vagina normal without discharge and uterus enlarged, soft and NT. Extremities: extremities normal, atraumatic, no cyanosis or edema    Assessment:    Pregnancy at [redacted]w[redacted]d weeks    Plan:    Initial labs drawn. Prenatal vitamins.  Counseling provided regarding continued use of seat belts, cessation of alcohol consumption, smoking or use of illicit drugs; infection precautions i.e., influenza/TDAP immunizations, toxoplasmosis,CMV, parvovirus, listeria and varicella; workplace safety, exercise during pregnancy; routine dental care, safe medications, sexual activity, hot tubs, saunas, pools, travel, caffeine use, fish and methlymercury, potential toxins, hair treatments, varicose veins Weight gain recommendations reviewed: underweight/BMI< 18.5--> gain 28 - 40 lbs; normal weight/BMI 18.5 - 24.9--> gain 25 - 35 lbs; overweight/BMI 25 - 29.9--> gain 15 - 25 lbs; obese/BMI >30->gain  11  - 20 lbs Problem list reviewed and updated. AFP3 discussed: requested. Role of ultrasound in pregnancy discussed; fetal survey: requested. Amniocentesis discussed: not indicated. Follow up in 2 weeks. 50% of 20 min visit spent on counseling and coordination of care.

## 2012-10-16 ENCOUNTER — Other Ambulatory Visit: Payer: Self-pay | Admitting: *Deleted

## 2012-10-16 DIAGNOSIS — B9689 Other specified bacterial agents as the cause of diseases classified elsewhere: Secondary | ICD-10-CM

## 2012-10-16 DIAGNOSIS — R519 Headache, unspecified: Secondary | ICD-10-CM | POA: Insufficient documentation

## 2012-10-16 DIAGNOSIS — J302 Other seasonal allergic rhinitis: Secondary | ICD-10-CM | POA: Insufficient documentation

## 2012-10-16 DIAGNOSIS — K219 Gastro-esophageal reflux disease without esophagitis: Secondary | ICD-10-CM | POA: Insufficient documentation

## 2012-10-16 LAB — OBSTETRIC PANEL
Antibody Screen: NEGATIVE
HCT: 34.7 % — ABNORMAL LOW (ref 36.0–46.0)
Hemoglobin: 11.7 g/dL — ABNORMAL LOW (ref 12.0–15.0)
Lymphocytes Relative: 21 % (ref 12–46)
Lymphs Abs: 2 10*3/uL (ref 0.7–4.0)
MCHC: 33.7 g/dL (ref 30.0–36.0)
Monocytes Absolute: 0.9 10*3/uL (ref 0.1–1.0)
Monocytes Relative: 10 % (ref 3–12)
Neutro Abs: 6.3 10*3/uL (ref 1.7–7.7)
Rh Type: POSITIVE
Rubella: 2.48 Index — ABNORMAL HIGH (ref <0.90)

## 2012-10-16 LAB — URINE CULTURE
Colony Count: NO GROWTH
Organism ID, Bacteria: NO GROWTH

## 2012-10-16 LAB — GC/CHLAMYDIA PROBE AMP: CT Probe RNA: NEGATIVE

## 2012-10-16 LAB — VITAMIN D 25 HYDROXY (VIT D DEFICIENCY, FRACTURES): Vit D, 25-Hydroxy: 37 ng/mL (ref 30–89)

## 2012-10-16 LAB — WET PREP BY MOLECULAR PROBE
Gardnerella vaginalis: POSITIVE — AB
Trichomonas vaginosis: NEGATIVE

## 2012-10-16 MED ORDER — METRONIDAZOLE 500 MG PO TABS: 500.0000 mg | ORAL_TABLET | Freq: Two times a day (BID) | ORAL | Status: DC

## 2012-10-16 NOTE — Progress Notes (Signed)
Pt aware of results and prescription sent to pt's pharmacy. Pt expressed understanding.

## 2012-10-30 ENCOUNTER — Encounter: Payer: Self-pay | Admitting: Obstetrics & Gynecology

## 2012-10-31 ENCOUNTER — Encounter: Payer: Self-pay | Admitting: Obstetrics & Gynecology

## 2012-10-31 DIAGNOSIS — R87613 High grade squamous intraepithelial lesion on cytologic smear of cervix (HGSIL): Secondary | ICD-10-CM | POA: Insufficient documentation

## 2012-11-06 ENCOUNTER — Ambulatory Visit (INDEPENDENT_AMBULATORY_CARE_PROVIDER_SITE_OTHER): Payer: Medicaid Other | Admitting: Obstetrics

## 2012-11-06 ENCOUNTER — Encounter: Payer: Self-pay | Admitting: Obstetrics

## 2012-11-06 ENCOUNTER — Encounter: Payer: Medicaid Other | Admitting: Obstetrics

## 2012-11-06 VITALS — BP 100/65 | Temp 99.2°F | Wt 140.0 lb

## 2012-11-06 DIAGNOSIS — Z3401 Encounter for supervision of normal first pregnancy, first trimester: Secondary | ICD-10-CM

## 2012-11-06 DIAGNOSIS — F329 Major depressive disorder, single episode, unspecified: Secondary | ICD-10-CM | POA: Insufficient documentation

## 2012-11-06 DIAGNOSIS — Z3482 Encounter for supervision of other normal pregnancy, second trimester: Secondary | ICD-10-CM

## 2012-11-06 DIAGNOSIS — F3289 Other specified depressive episodes: Secondary | ICD-10-CM

## 2012-11-06 DIAGNOSIS — Z369 Encounter for antenatal screening, unspecified: Secondary | ICD-10-CM

## 2012-11-06 DIAGNOSIS — Z3480 Encounter for supervision of other normal pregnancy, unspecified trimester: Secondary | ICD-10-CM

## 2012-11-06 DIAGNOSIS — F17200 Nicotine dependence, unspecified, uncomplicated: Secondary | ICD-10-CM | POA: Insufficient documentation

## 2012-11-06 DIAGNOSIS — Z3400 Encounter for supervision of normal first pregnancy, unspecified trimester: Secondary | ICD-10-CM

## 2012-11-06 DIAGNOSIS — F322 Major depressive disorder, single episode, severe without psychotic features: Secondary | ICD-10-CM | POA: Insufficient documentation

## 2012-11-06 LAB — POCT URINALYSIS DIPSTICK
Blood, UA: NEGATIVE
Spec Grav, UA: <=1.005
Urobilinogen, UA: NEGATIVE

## 2012-11-06 MED ORDER — BUPROPION HCL ER (SR) 150 MG PO TB12: 150.0000 mg | ORAL_TABLET | Freq: Two times a day (BID) | ORAL | Status: DC

## 2012-11-06 NOTE — Progress Notes (Signed)
P 83 Patient reports she has some upper abdominal pain.

## 2012-11-07 ENCOUNTER — Encounter: Payer: Self-pay | Admitting: Obstetrics

## 2012-11-18 ENCOUNTER — Telehealth: Payer: Self-pay | Admitting: *Deleted

## 2012-11-18 NOTE — Telephone Encounter (Signed)
Message copied by Glendell Docker on Tue Nov 18, 2012  7:56 PM ------      Message from: Antionette Char      Created: Fri Oct 31, 2012  8:57 PM       Needs colposcopy ------

## 2012-11-18 NOTE — Telephone Encounter (Signed)
Noted in patients ob sticky note that patient need to be made aware of abnormal papsmear and need for colposcopy after delivery.

## 2012-11-19 ENCOUNTER — Other Ambulatory Visit: Payer: Self-pay | Admitting: Obstetrics

## 2012-11-19 ENCOUNTER — Ambulatory Visit (HOSPITAL_COMMUNITY)
Admission: RE | Admit: 2012-11-19 | Discharge: 2012-11-19 | Disposition: A | Discharge status: Home / Self Care | Payer: Medicaid Other | Source: Ambulatory Visit | Attending: Obstetrics | Admitting: Obstetrics

## 2012-11-19 ENCOUNTER — Ambulatory Visit: Payer: Medicaid Other

## 2012-11-19 DIAGNOSIS — Z369 Encounter for antenatal screening, unspecified: Secondary | ICD-10-CM

## 2012-11-19 DIAGNOSIS — Z3689 Encounter for other specified antenatal screening: Secondary | ICD-10-CM | POA: Insufficient documentation

## 2012-12-04 ENCOUNTER — Ambulatory Visit (INDEPENDENT_AMBULATORY_CARE_PROVIDER_SITE_OTHER): Payer: Medicaid Other | Admitting: Obstetrics

## 2012-12-04 ENCOUNTER — Encounter: Payer: Self-pay | Admitting: Obstetrics

## 2012-12-04 VITALS — BP 118/76 | Temp 98.9°F | Wt 145.0 lb

## 2012-12-04 DIAGNOSIS — Z3402 Encounter for supervision of normal first pregnancy, second trimester: Secondary | ICD-10-CM

## 2012-12-04 DIAGNOSIS — Z3400 Encounter for supervision of normal first pregnancy, unspecified trimester: Secondary | ICD-10-CM

## 2012-12-04 LAB — POCT URINALYSIS DIPSTICK
Glucose, UA: NEGATIVE
Ketones, UA: NEGATIVE
Spec Grav, UA: 1.02

## 2012-12-04 NOTE — Progress Notes (Signed)
P 103 Patient reports she is doing well.

## 2013-01-01 ENCOUNTER — Ambulatory Visit (INDEPENDENT_AMBULATORY_CARE_PROVIDER_SITE_OTHER): Payer: Medicaid Other | Admitting: Obstetrics

## 2013-01-01 ENCOUNTER — Encounter: Payer: Self-pay | Admitting: Obstetrics

## 2013-01-01 ENCOUNTER — Other Ambulatory Visit: Payer: Medicaid Other

## 2013-01-01 VITALS — BP 121/72 | Temp 97.8°F | Wt 150.0 lb

## 2013-01-01 DIAGNOSIS — Z3400 Encounter for supervision of normal first pregnancy, unspecified trimester: Secondary | ICD-10-CM

## 2013-01-01 DIAGNOSIS — Z3480 Encounter for supervision of other normal pregnancy, unspecified trimester: Secondary | ICD-10-CM

## 2013-01-01 DIAGNOSIS — Z3402 Encounter for supervision of normal first pregnancy, second trimester: Secondary | ICD-10-CM

## 2013-01-01 LAB — CBC
HCT: 32.1 % — ABNORMAL LOW (ref 36.0–46.0)
Hemoglobin: 11 g/dL — ABNORMAL LOW (ref 12.0–15.0)
MCH: 32.7 pg (ref 26.0–34.0)
MCHC: 34.3 g/dL (ref 30.0–36.0)
MCV: 95.5 fL (ref 78.0–100.0)
RBC: 3.36 MIL/uL — ABNORMAL LOW (ref 3.87–5.11)
RDW: 13.6 % (ref 11.5–15.5)

## 2013-01-01 LAB — POCT URINALYSIS DIPSTICK
Spec Grav, UA: 1.02
pH, UA: 5

## 2013-01-01 NOTE — Addendum Note (Signed)
Addended by: George Hugh on: 01/01/2013 11:07 AM   Modules accepted: Orders

## 2013-01-01 NOTE — Addendum Note (Signed)
Addended by: George Hugh on: 01/01/2013 10:02 AM   Modules accepted: Orders

## 2013-01-01 NOTE — Progress Notes (Signed)
Pulse- 88 Pt states she gets some pressure in her abdomen when baby balls up.

## 2013-01-15 ENCOUNTER — Ambulatory Visit (INDEPENDENT_AMBULATORY_CARE_PROVIDER_SITE_OTHER): Payer: Medicaid Other | Admitting: Obstetrics & Gynecology

## 2013-01-15 VITALS — BP 119/78 | Temp 97.9°F | Wt 156.0 lb

## 2013-01-15 DIAGNOSIS — Z3400 Encounter for supervision of normal first pregnancy, unspecified trimester: Secondary | ICD-10-CM

## 2013-01-15 DIAGNOSIS — Z3402 Encounter for supervision of normal first pregnancy, second trimester: Secondary | ICD-10-CM

## 2013-01-15 LAB — POCT URINALYSIS DIPSTICK
Bilirubin, UA: NEGATIVE
Blood, UA: NEGATIVE
Glucose, UA: NEGATIVE
Ketones, UA: NEGATIVE
Nitrite, UA: NEGATIVE
Protein, UA: NEGATIVE
Spec Grav, UA: 1.01
Urobilinogen, UA: NEGATIVE
pH, UA: 7

## 2013-01-15 NOTE — Progress Notes (Signed)
Pulse- 97 Pt states she is having some discomfort on left side when baby is  Moving. Pt states she is having pain in her left leg. Pt states she is also having lower back back on the left side.

## 2013-01-15 NOTE — Patient Instructions (Signed)
Pregnancy and Smoking Smoking during pregnancy is very unhealthy for the mother and the developing fetus. The addictive drug in cigarettes (nicotine), carbon monoxide, and many other poisons are inhaled from a cigarette and are carried through your bloodstream to your fetus. Cigarette smoke contains more than 2,500 chemicals. It is not known which of these chemicals are harmful to the developing fetus. However, both nicotine and carbon monoxide play a role in causing health problems in pregnancy. Effects on the fetus of smoking during pregnancy:  Decrease in blood flow and oxygen to the uterus, placenta, and your fetus.  Increased heart rate of the fetus.  Slowing of your fetus's growth in the uterus (intrauterine growth retardation).  Placental problems. Placenta may partially cover or completely cover the opening to the cervix (placenta previa), or the placenta may partially or completely separate from the uterus (placental abruption).  Increase risk of pregnancy outside of the uterus (tubal pregnancy).  Premature rupture membranes, causing the sac that holds the fetus to break too early, resulting in premature birth and increased health risks to the newborn.  Increased risk of birth defects, including heart defects.  Increased risk of miscarriage. Newborns born to women who smoke during pregnancy:  Are more likely to be born too early (prematurely).  Are more likely to be at a low birth weight.  Are at risk for serious health problems, chronic or lifelong disabilities (cerebral palsy, mental retardation, learning problems), and possibly even death  Are at risk of Sudden Infant Death Syndrome (SIDS).  Have higher rates of miscarriage and stillbirth.  Have more lung and breathing (respiratory) problems. Long-term effects on a child's behavior: Some of the following trends are seen with children of smoking mothers:  Increased risk for drug abuse, behavior, and conduct  disorders.  Increased risk for smoking in adolescent girls.  Increased risk for negative behavior in 2-year-olds.  Increase risk for asthma, colic, and childhood obesity, which can lead to diabetes.  Increased risk for finger and toe disorders. Resources to stop smoking during pregnancy:  Counseling.  Psychological treatment.  Acupuncture.  Family intervention.  Hypnosis.  Medicines that are safe to take during pregnancy. Nicotine supplements have not been studied enough. They should only be considered when all other methods fail.  Telephone QUIT lines. Smoking and Breastfeeding:  Nicotine gets passed to the infant through a mother's breastmilk. This can cause nausea, colic, cramping, and diarrhea in the infant.  Smoking may reduce milk supply and interfere with the let-down response.  Even formula-fed infants of mothers who smoke have nicotine and cotinine (nicotine by-product) in their urine. Other resources to help stop smoking:  American Cancer Society: www.cancer.org  American Heart Association: www.americanheart.org  National Cancer Institute: www.cancer.gov  Smoke Free Families: www.smokefreefamilies.[address] [place]): (870) 167-9551 [facility] Document Released: 07/03/2004 Document Revised: 05/14/2011 Document Reviewed: 12/01/2008 [facility] Patient Information 2014 [place], [place].

## 2013-01-27 ENCOUNTER — Ambulatory Visit (INDEPENDENT_AMBULATORY_CARE_PROVIDER_SITE_OTHER): Payer: Medicaid Other | Admitting: Obstetrics

## 2013-01-27 ENCOUNTER — Encounter: Payer: Self-pay | Admitting: Obstetrics

## 2013-01-27 VITALS — BP 124/73 | Temp 98.2°F | Wt 159.0 lb

## 2013-01-27 DIAGNOSIS — K089 Disorder of teeth and supporting structures, unspecified: Secondary | ICD-10-CM

## 2013-01-27 DIAGNOSIS — K0889 Other specified disorders of teeth and supporting structures: Secondary | ICD-10-CM

## 2013-01-27 DIAGNOSIS — Z3403 Encounter for supervision of normal first pregnancy, third trimester: Secondary | ICD-10-CM

## 2013-01-27 DIAGNOSIS — Z3400 Encounter for supervision of normal first pregnancy, unspecified trimester: Secondary | ICD-10-CM

## 2013-01-27 LAB — POCT URINALYSIS DIPSTICK
Glucose, UA: NEGATIVE
Ketones, UA: NEGATIVE
Leukocytes, UA: NEGATIVE
Protein, UA: NEGATIVE
Urobilinogen, UA: NEGATIVE
pH, UA: 5

## 2013-01-27 MED ORDER — OXYCODONE HCL 10 MG PO TABS: ORAL_TABLET | ORAL | Status: DC

## 2013-01-27 NOTE — Progress Notes (Signed)
Pulse 92 Pt states that she is doing well.

## 2013-01-28 ENCOUNTER — Encounter: Payer: Self-pay | Admitting: Obstetrics

## 2013-01-28 DIAGNOSIS — K0889 Other specified disorders of teeth and supporting structures: Secondary | ICD-10-CM | POA: Insufficient documentation

## 2013-02-10 ENCOUNTER — Ambulatory Visit (INDEPENDENT_AMBULATORY_CARE_PROVIDER_SITE_OTHER): Payer: Medicaid Other | Admitting: Obstetrics

## 2013-02-10 ENCOUNTER — Encounter: Payer: Medicaid Other | Admitting: Obstetrics

## 2013-02-10 VITALS — BP 125/77 | Temp 97.5°F | Wt 155.0 lb

## 2013-02-10 DIAGNOSIS — O365931 Maternal care for other known or suspected poor fetal growth, third trimester, fetus 1: Secondary | ICD-10-CM

## 2013-02-10 DIAGNOSIS — Z3400 Encounter for supervision of normal first pregnancy, unspecified trimester: Secondary | ICD-10-CM

## 2013-02-10 DIAGNOSIS — Z3403 Encounter for supervision of normal first pregnancy, third trimester: Secondary | ICD-10-CM

## 2013-02-10 DIAGNOSIS — O365990 Maternal care for other known or suspected poor fetal growth, unspecified trimester, not applicable or unspecified: Secondary | ICD-10-CM

## 2013-02-10 LAB — POCT URINALYSIS DIPSTICK
Bilirubin, UA: NEGATIVE
Ketones, UA: NEGATIVE
Nitrite, UA: NEGATIVE
pH, UA: 5

## 2013-02-10 NOTE — Progress Notes (Signed)
Pulse- 106 Pt states she is having pain in her right side. Pt states she is itching and states it is unbearable.

## 2013-02-11 ENCOUNTER — Encounter: Payer: Self-pay | Admitting: Obstetrics

## 2013-02-12 ENCOUNTER — Ambulatory Visit (HOSPITAL_COMMUNITY)
Admission: RE | Admit: 2013-02-12 | Discharge: 2013-02-12 | Disposition: A | Discharge status: Home / Self Care | Payer: Medicaid Other | Source: Ambulatory Visit | Attending: Obstetrics | Admitting: Obstetrics

## 2013-02-12 DIAGNOSIS — O4400 Placenta previa specified as without hemorrhage, unspecified trimester: Secondary | ICD-10-CM | POA: Insufficient documentation

## 2013-02-12 DIAGNOSIS — O99330 Smoking (tobacco) complicating pregnancy, unspecified trimester: Secondary | ICD-10-CM | POA: Insufficient documentation

## 2013-02-12 DIAGNOSIS — O365990 Maternal care for other known or suspected poor fetal growth, unspecified trimester, not applicable or unspecified: Secondary | ICD-10-CM | POA: Insufficient documentation

## 2013-02-17 ENCOUNTER — Other Ambulatory Visit: Payer: Medicaid Other

## 2013-02-24 ENCOUNTER — Encounter: Payer: Medicaid Other | Admitting: Obstetrics

## 2013-03-04 ENCOUNTER — Ambulatory Visit (INDEPENDENT_AMBULATORY_CARE_PROVIDER_SITE_OTHER): Payer: Medicaid Other | Admitting: Obstetrics

## 2013-03-04 VITALS — BP 123/84 | Temp 98.1°F | Wt 161.0 lb

## 2013-03-04 DIAGNOSIS — Z3403 Encounter for supervision of normal first pregnancy, third trimester: Secondary | ICD-10-CM

## 2013-03-04 DIAGNOSIS — Z3400 Encounter for supervision of normal first pregnancy, unspecified trimester: Secondary | ICD-10-CM

## 2013-03-04 LAB — POCT URINALYSIS DIPSTICK
Blood, UA: NEGATIVE
Leukocytes, UA: NEGATIVE
Protein, UA: NEGATIVE
Spec Grav, UA: 1.015
Urobilinogen, UA: NEGATIVE

## 2013-03-04 NOTE — Progress Notes (Signed)
Pulse 102 Pt states that she been having some contraction that come and go.  Pt states that she is having lower abdominal pressure.

## 2013-03-05 NOTE — L&D Delivery Note (Signed)
Delivery Note At 4:12 PM a viable female was delivered via Vaginal, Spontaneous Delivery (Presentation: LOA ).   Placenta status: Intact, Spontaneous, via Tomasa BlaseSchultz.  Cord: 3 vessels with the following complications: None.    Anesthesia: Epidural  Episiotomy: None Lacerations: 2nd degree, labial Suture Repair: 2.0 3.0 vicryl rapide Est. Blood Loss (mL): 200 ml  Mom to postpartum.  Baby to Couplet care / Skin to Skin.  JACKSON-MOORE,Jelitza Manninen A 04/08/2013, 4:46 PM

## 2013-03-06 ENCOUNTER — Encounter: Payer: Self-pay | Admitting: Obstetrics

## 2013-03-06 LAB — STREP B DNA PROBE: GBSP: NEGATIVE

## 2013-03-11 ENCOUNTER — Ambulatory Visit (INDEPENDENT_AMBULATORY_CARE_PROVIDER_SITE_OTHER): Payer: Medicaid Other | Admitting: Obstetrics

## 2013-03-11 ENCOUNTER — Encounter: Payer: Self-pay | Admitting: Obstetrics

## 2013-03-11 VITALS — BP 128/82 | Temp 96.3°F | Wt 164.0 lb

## 2013-03-11 DIAGNOSIS — Z3403 Encounter for supervision of normal first pregnancy, third trimester: Secondary | ICD-10-CM

## 2013-03-11 DIAGNOSIS — Z3400 Encounter for supervision of normal first pregnancy, unspecified trimester: Secondary | ICD-10-CM

## 2013-03-11 LAB — POCT URINALYSIS DIPSTICK
Blood, UA: NEGATIVE
GLUCOSE UA: NEGATIVE
KETONES UA: NEGATIVE
LEUKOCYTES UA: NEGATIVE
Nitrite, UA: NEGATIVE
PROTEIN UA: NEGATIVE
SPEC GRAV UA: 1.015
pH, UA: 6

## 2013-03-11 NOTE — Progress Notes (Signed)
P 91 Patient had US 3 weeks ago- would like to talk about results.

## 2013-03-18 ENCOUNTER — Ambulatory Visit (INDEPENDENT_AMBULATORY_CARE_PROVIDER_SITE_OTHER): Payer: Medicaid Other | Admitting: Obstetrics

## 2013-03-18 VITALS — BP 126/85 | Temp 98.3°F | Wt 164.0 lb

## 2013-03-18 DIAGNOSIS — Z3480 Encounter for supervision of other normal pregnancy, unspecified trimester: Secondary | ICD-10-CM

## 2013-03-18 LAB — POCT URINALYSIS DIPSTICK
Bilirubin, UA: NEGATIVE
Blood, UA: NEGATIVE
Glucose, UA: NEGATIVE
Ketones, UA: NEGATIVE
LEUKOCYTES UA: NEGATIVE
NITRITE UA: NEGATIVE
PH UA: 6
PROTEIN UA: NEGATIVE
Spec Grav, UA: 1.015
Urobilinogen, UA: NEGATIVE

## 2013-03-18 NOTE — Progress Notes (Signed)
Pulse- 92 Patient states she is having lower abdomen pressure and pain.

## 2013-03-19 ENCOUNTER — Encounter: Payer: Self-pay | Admitting: Obstetrics

## 2013-03-25 ENCOUNTER — Ambulatory Visit (INDEPENDENT_AMBULATORY_CARE_PROVIDER_SITE_OTHER): Payer: Medicaid Other | Admitting: Obstetrics

## 2013-03-25 ENCOUNTER — Encounter: Payer: Self-pay | Admitting: Obstetrics

## 2013-03-25 VITALS — BP 143/88 | Temp 98.3°F | Wt 163.0 lb

## 2013-03-25 DIAGNOSIS — Z3480 Encounter for supervision of other normal pregnancy, unspecified trimester: Secondary | ICD-10-CM

## 2013-03-25 NOTE — Progress Notes (Signed)
Pulse: 104 Patient states she is having lower abdominal pressure. Patient states she is having some contractions but they are irregular.

## 2013-03-28 ENCOUNTER — Other Ambulatory Visit: Payer: Self-pay | Admitting: Obstetrics

## 2013-03-30 NOTE — Telephone Encounter (Signed)
Please Review

## 2013-04-01 ENCOUNTER — Other Ambulatory Visit: Payer: Self-pay | Admitting: Obstetrics

## 2013-04-01 ENCOUNTER — Ambulatory Visit (INDEPENDENT_AMBULATORY_CARE_PROVIDER_SITE_OTHER): Payer: Medicaid Other | Admitting: Obstetrics

## 2013-04-01 ENCOUNTER — Encounter: Payer: Self-pay | Admitting: Obstetrics

## 2013-04-01 VITALS — BP 129/89 | Temp 97.3°F | Wt 164.0 lb

## 2013-04-01 DIAGNOSIS — Z3400 Encounter for supervision of normal first pregnancy, unspecified trimester: Secondary | ICD-10-CM

## 2013-04-01 LAB — POCT URINALYSIS DIPSTICK
Glucose, UA: NEGATIVE
Ketones, UA: NEGATIVE
Leukocytes, UA: NEGATIVE
NITRITE UA: NEGATIVE
PH UA: 6
PROTEIN UA: NEGATIVE
RBC UA: NEGATIVE
Spec Grav, UA: 1.015

## 2013-04-01 NOTE — Progress Notes (Signed)
Pulse: 86

## 2013-04-07 ENCOUNTER — Encounter (HOSPITAL_COMMUNITY): Payer: Self-pay | Admitting: General Practice

## 2013-04-07 ENCOUNTER — Inpatient Hospital Stay (HOSPITAL_COMMUNITY)
Admission: AD | Admit: 2013-04-07 | Discharge: 2013-04-10 | DRG: 775 | Disposition: A | Discharge status: Home / Self Care | Payer: Medicaid Other | Source: Ambulatory Visit | Attending: Obstetrics | Admitting: Obstetrics

## 2013-04-07 ENCOUNTER — Inpatient Hospital Stay (HOSPITAL_COMMUNITY): Payer: Medicaid Other | Admitting: Anesthesiology

## 2013-04-07 ENCOUNTER — Encounter (HOSPITAL_COMMUNITY): Payer: Medicaid Other | Admitting: Anesthesiology

## 2013-04-07 ENCOUNTER — Encounter (HOSPITAL_COMMUNITY): Payer: Self-pay | Admitting: *Deleted

## 2013-04-07 ENCOUNTER — Inpatient Hospital Stay (HOSPITAL_COMMUNITY)
Admission: AD | Admit: 2013-04-07 | Discharge: 2013-04-07 | Disposition: A | Discharge status: Home / Self Care | Payer: Medicaid Other | Source: Ambulatory Visit | Attending: Obstetrics | Admitting: Obstetrics

## 2013-04-07 DIAGNOSIS — O26859 Spotting complicating pregnancy, unspecified trimester: Secondary | ICD-10-CM | POA: Insufficient documentation

## 2013-04-07 DIAGNOSIS — O479 False labor, unspecified: Secondary | ICD-10-CM | POA: Insufficient documentation

## 2013-04-07 DIAGNOSIS — K219 Gastro-esophageal reflux disease without esophagitis: Secondary | ICD-10-CM | POA: Diagnosis present

## 2013-04-07 DIAGNOSIS — O99334 Smoking (tobacco) complicating childbirth: Secondary | ICD-10-CM | POA: Diagnosis present

## 2013-04-07 DIAGNOSIS — IMO0001 Reserved for inherently not codable concepts without codable children: Secondary | ICD-10-CM

## 2013-04-07 DIAGNOSIS — F329 Major depressive disorder, single episode, unspecified: Secondary | ICD-10-CM | POA: Diagnosis present

## 2013-04-07 DIAGNOSIS — O99344 Other mental disorders complicating childbirth: Secondary | ICD-10-CM | POA: Diagnosis present

## 2013-04-07 DIAGNOSIS — F3289 Other specified depressive episodes: Secondary | ICD-10-CM | POA: Diagnosis present

## 2013-04-07 LAB — CBC
HCT: 34.8 % — ABNORMAL LOW (ref 36.0–46.0)
HEMOGLOBIN: 12 g/dL (ref 12.0–15.0)
MCH: 31.9 pg (ref 26.0–34.0)
MCHC: 34.5 g/dL (ref 30.0–36.0)
MCV: 92.6 fL (ref 78.0–100.0)
Platelets: 130 10*3/uL — ABNORMAL LOW (ref 150–400)
RBC: 3.76 MIL/uL — AB (ref 3.87–5.11)
RDW: 14.6 % (ref 11.5–15.5)
WBC: 17.4 10*3/uL — ABNORMAL HIGH (ref 4.0–10.5)

## 2013-04-07 MED ORDER — EPHEDRINE 5 MG/ML INJ
10.0000 mg | INTRAVENOUS | Status: DC | PRN
  Filled 2013-04-07: qty 2

## 2013-04-07 MED ORDER — PHENYLEPHRINE 40 MCG/ML (10ML) SYRINGE FOR IV PUSH (FOR BLOOD PRESSURE SUPPORT)
80.0000 ug | PREFILLED_SYRINGE | INTRAVENOUS | Status: DC | PRN
  Filled 2013-04-07: qty 2
  Filled 2013-04-07: qty 10

## 2013-04-07 MED ORDER — CITRIC ACID-SODIUM CITRATE 334-500 MG/5ML PO SOLN: 30.0000 mL | ORAL | Status: DC | PRN

## 2013-04-07 MED ORDER — LIDOCAINE HCL (PF) 1 % IJ SOLN
30.0000 mL | INTRAMUSCULAR | Status: DC | PRN
  Administered 2013-04-08: 30 mL via SUBCUTANEOUS
  Filled 2013-04-07 (×2): qty 30

## 2013-04-07 MED ORDER — PHENYLEPHRINE 40 MCG/ML (10ML) SYRINGE FOR IV PUSH (FOR BLOOD PRESSURE SUPPORT)
80.0000 ug | PREFILLED_SYRINGE | INTRAVENOUS | Status: DC | PRN
  Filled 2013-04-07: qty 2

## 2013-04-07 MED ORDER — EPHEDRINE 5 MG/ML INJ
10.0000 mg | INTRAVENOUS | Status: DC | PRN
  Filled 2013-04-07: qty 2
  Filled 2013-04-07: qty 4

## 2013-04-07 MED ORDER — ACETAMINOPHEN 325 MG PO TABS: 650.0000 mg | ORAL_TABLET | ORAL | Status: DC | PRN

## 2013-04-07 MED ORDER — OXYTOCIN BOLUS FROM INFUSION: 500.0000 mL | INTRAVENOUS | Status: DC

## 2013-04-07 MED ORDER — DIPHENHYDRAMINE HCL 50 MG/ML IJ SOLN: 12.5000 mg | INTRAMUSCULAR | Status: DC | PRN

## 2013-04-07 MED ORDER — LACTATED RINGERS IV SOLN
500.0000 mL | Freq: Once | INTRAVENOUS | Status: AC
  Administered 2013-04-08: 500 mL via INTRAVENOUS

## 2013-04-07 MED ORDER — LACTATED RINGERS IV SOLN
INTRAVENOUS | Status: DC
  Administered 2013-04-07 – 2013-04-08 (×2): via INTRAVENOUS

## 2013-04-07 MED ORDER — FLEET ENEMA 7-19 GM/118ML RE ENEM: 1.0000 | ENEMA | RECTAL | Status: DC | PRN

## 2013-04-07 MED ORDER — ONDANSETRON HCL 4 MG/2ML IJ SOLN: 4.0000 mg | Freq: Four times a day (QID) | INTRAMUSCULAR | Status: DC | PRN

## 2013-04-07 MED ORDER — LACTATED RINGERS IV SOLN
500.0000 mL | INTRAVENOUS | Status: DC | PRN
Start: 2013-04-07 — End: 2013-04-08

## 2013-04-07 MED ORDER — OXYCODONE-ACETAMINOPHEN 5-325 MG PO TABS
2.0000 | ORAL_TABLET | Freq: Once | ORAL | Status: AC
  Administered 2013-04-07: 2 via ORAL
  Filled 2013-04-07: qty 2

## 2013-04-07 MED ORDER — IBUPROFEN 600 MG PO TABS: 600.0000 mg | ORAL_TABLET | Freq: Four times a day (QID) | ORAL | Status: DC | PRN

## 2013-04-07 MED ORDER — BUTORPHANOL TARTRATE 1 MG/ML IJ SOLN: 1.0000 mg | INTRAMUSCULAR | Status: DC | PRN

## 2013-04-07 MED ORDER — OXYTOCIN 40 UNITS IN LACTATED RINGERS INFUSION - SIMPLE MED: 62.5000 mL/h | INTRAVENOUS | Status: DC

## 2013-04-07 MED ORDER — OXYCODONE-ACETAMINOPHEN 5-325 MG PO TABS: 1.0000 | ORAL_TABLET | ORAL | Status: DC | PRN

## 2013-04-07 MED ORDER — FENTANYL 2.5 MCG/ML BUPIVACAINE 1/10 % EPIDURAL INFUSION (WH - ANES)
14.0000 mL/h | INTRAMUSCULAR | Status: DC | PRN
  Administered 2013-04-08: 14 mL/h via EPIDURAL
  Filled 2013-04-07 (×3): qty 125

## 2013-04-07 NOTE — Anesthesia Preprocedure Evaluation (Signed)
Anesthesia Evaluation    Airway Mallampati: II TM Distance: >3 FB Neck ROM: Full    Dental no notable dental hx. (+) Poor Dentition and Teeth Intact   Pulmonary Current Smoker,  breath sounds clear to auscultation  Pulmonary exam normal       Cardiovascular negative cardio ROS  Rhythm:Regular Rate:Normal     Neuro/Psych  Headaches, PSYCHIATRIC DISORDERS Depression    GI/Hepatic Neg liver ROS, GERD-  Medicated and Controlled,  Endo/Other  negative endocrine ROS  Renal/GU negative Renal ROS  negative genitourinary   Musculoskeletal negative musculoskeletal ROS (+)   Abdominal (+) - obese,   Peds  Hematology Thrombocytopenia- probably gestational   Anesthesia Other Findings   Reproductive/Obstetrics (+) Pregnancy                           Anesthesia Physical Anesthesia Plan  ASA: II  Anesthesia Plan:    Post-op Pain Management:    Induction:   Airway Management Planned: Natural Airway  Additional Equipment:   Intra-op Plan:   Post-operative Plan:   Informed Consent: I have reviewed the patients History and Physical, chart, labs and discussed the procedure including the risks, benefits and alternatives for the proposed anesthesia with the patient or authorized representative who has indicated his/her understanding and acceptance.     Plan Discussed with: Anesthesiologist  Anesthesia Plan Comments:         Anesthesia Quick Evaluation

## 2013-04-07 NOTE — MAU Note (Signed)
Patient states she is having contractions about every 5-7 minutes. Has had some spotting. Reports good fetal movement, no leaking. S/S of flu denies.

## 2013-04-07 NOTE — MAU Note (Signed)
Pt states she has been having contractions since 2am

## 2013-04-07 NOTE — Discharge Instructions (Signed)

## 2013-04-08 ENCOUNTER — Encounter (HOSPITAL_COMMUNITY): Payer: Self-pay | Admitting: *Deleted

## 2013-04-08 ENCOUNTER — Encounter: Payer: Medicaid Other | Admitting: Obstetrics

## 2013-04-08 LAB — RPR: RPR Ser Ql: NONREACTIVE

## 2013-04-08 MED ORDER — IBUPROFEN 600 MG PO TABS
600.0000 mg | ORAL_TABLET | Freq: Four times a day (QID) | ORAL | Status: DC
  Administered 2013-04-08 – 2013-04-10 (×7): 600 mg via ORAL
  Filled 2013-04-08 (×7): qty 1

## 2013-04-08 MED ORDER — LIDOCAINE HCL (PF) 1 % IJ SOLN
INTRAMUSCULAR | Status: DC | PRN
  Administered 2013-04-08 (×2): 4 mL

## 2013-04-08 MED ORDER — MEASLES, MUMPS & RUBELLA VAC ~~LOC~~ INJ
0.5000 mL | INJECTION | Freq: Once | SUBCUTANEOUS | Status: DC
  Filled 2013-04-08: qty 0.5

## 2013-04-08 MED ORDER — SENNOSIDES-DOCUSATE SODIUM 8.6-50 MG PO TABS
2.0000 | ORAL_TABLET | ORAL | Status: DC
  Administered 2013-04-08 – 2013-04-10 (×2): 2 via ORAL
  Filled 2013-04-08 (×2): qty 2

## 2013-04-08 MED ORDER — TERBUTALINE SULFATE 1 MG/ML IJ SOLN: 0.2500 mg | Freq: Once | INTRAMUSCULAR | Status: DC | PRN

## 2013-04-08 MED ORDER — WITCH HAZEL-GLYCERIN EX PADS: 1.0000 "application " | MEDICATED_PAD | CUTANEOUS | Status: DC | PRN

## 2013-04-08 MED ORDER — OXYTOCIN 40 UNITS IN LACTATED RINGERS INFUSION - SIMPLE MED
1.0000 m[IU]/min | INTRAVENOUS | Status: DC
  Administered 2013-04-08: 2 m[IU]/min via INTRAVENOUS
  Filled 2013-04-08: qty 1000

## 2013-04-08 MED ORDER — OXYCODONE-ACETAMINOPHEN 5-325 MG PO TABS
1.0000 | ORAL_TABLET | ORAL | Status: DC | PRN
  Administered 2013-04-09 – 2013-04-10 (×5): 1 via ORAL
  Filled 2013-04-08 (×5): qty 1

## 2013-04-08 MED ORDER — TETANUS-DIPHTH-ACELL PERTUSSIS 5-2.5-18.5 LF-MCG/0.5 IM SUSP
0.5000 mL | Freq: Once | INTRAMUSCULAR | Status: AC
  Administered 2013-04-10: 0.5 mL via INTRAMUSCULAR

## 2013-04-08 MED ORDER — FERROUS SULFATE 325 (65 FE) MG PO TABS
325.0000 mg | ORAL_TABLET | Freq: Two times a day (BID) | ORAL | Status: DC
  Administered 2013-04-09 – 2013-04-10 (×3): 325 mg via ORAL
  Filled 2013-04-08 (×3): qty 1

## 2013-04-08 MED ORDER — MAGNESIUM HYDROXIDE 400 MG/5ML PO SUSP: 30.0000 mL | ORAL | Status: DC | PRN

## 2013-04-08 MED ORDER — DIPHENHYDRAMINE HCL 25 MG PO CAPS: 25.0000 mg | ORAL_CAPSULE | Freq: Four times a day (QID) | ORAL | Status: DC | PRN

## 2013-04-08 MED ORDER — ONDANSETRON HCL 4 MG/2ML IJ SOLN: 4.0000 mg | INTRAMUSCULAR | Status: DC | PRN

## 2013-04-08 MED ORDER — PRENATAL MULTIVITAMIN CH
1.0000 | ORAL_TABLET | Freq: Every day | ORAL | Status: DC
  Administered 2013-04-09: 1 via ORAL
  Filled 2013-04-08: qty 1

## 2013-04-08 MED ORDER — BUPROPION HCL ER (SR) 150 MG PO TB12
150.0000 mg | ORAL_TABLET | Freq: Two times a day (BID) | ORAL | Status: DC
  Administered 2013-04-09 – 2013-04-10 (×4): 150 mg via ORAL
  Filled 2013-04-08 (×6): qty 1

## 2013-04-08 MED ORDER — LANOLIN HYDROUS EX OINT: TOPICAL_OINTMENT | CUTANEOUS | Status: DC | PRN

## 2013-04-08 MED ORDER — DIBUCAINE 1 % RE OINT: 1.0000 "application " | TOPICAL_OINTMENT | RECTAL | Status: DC | PRN

## 2013-04-08 MED ORDER — BENZOCAINE-MENTHOL 20-0.5 % EX AERO
1.0000 "application " | INHALATION_SPRAY | CUTANEOUS | Status: DC | PRN
  Administered 2013-04-08: 1 via TOPICAL
  Filled 2013-04-08: qty 56

## 2013-04-08 MED ORDER — ONDANSETRON HCL 4 MG PO TABS: 4.0000 mg | ORAL_TABLET | ORAL | Status: DC | PRN

## 2013-04-08 MED ORDER — FENTANYL 2.5 MCG/ML BUPIVACAINE 1/10 % EPIDURAL INFUSION (WH - ANES)
INTRAMUSCULAR | Status: DC | PRN
  Administered 2013-04-08: 14 mL/h via EPIDURAL

## 2013-04-08 MED ORDER — ZOLPIDEM TARTRATE 5 MG PO TABS: 5.0000 mg | ORAL_TABLET | Freq: Every evening | ORAL | Status: DC | PRN

## 2013-04-08 NOTE — H&P (Signed)
Meghan [name] is a 33 y.o. female presenting for contractions. Maternal Medical History:  Reason for admission: Contractions.   Contractions: Onset was 6-12 hours ago.   Frequency: regular.   Perceived severity is strong.    Fetal activity: Perceived fetal activity is normal.    Prenatal complications: no prenatal complications Prenatal Complications - Diabetes: none.    OB History   Grav Para Term Preterm Abortions TAB SAB Ect Mult Living   2    1          Past Medical History  Diagnosis Date  . Medical history non-contributory    Past Surgical History  Procedure Laterality Date  . Fracture surgery    . Laceration repair  11/28/2011    Procedure: REPAIR MULTIPLE LACERATIONS;  Surgeon: Wayland Denislaire Sanger, DO;  Location: [facility] OR;  Service: Plastics;  Laterality: N/A;   Family History: family history includes COPD in her father; Cancer in her mother; Diabetes in her paternal grandmother; Heart disease in her paternal grandfather; Hypertension in her mother; Liver disease in her father; Stroke in her maternal aunt. Social History:  reports that she has been smoking Cigarettes.  She has been smoking about 1.00 pack per day. She has never used smokeless tobacco. She reports that she does not drink alcohol or use illicit drugs.     Review of Systems  Constitutional: Negative for fever.  Eyes: Negative for blurred vision.  Respiratory: Negative for shortness of breath.   Gastrointestinal: Negative for vomiting.  Skin: Negative for rash.  Neurological: Negative for headaches.    Dilation: 7 Effacement (%): 90 Station: -2 Exam by:: dr Germain Osgoodjackon moore Blood pressure 126/87, pulse 109, temperature 98.5 F (36.9 C), temperature source Oral, resp. rate 20, height 5\' 7"  (1.702 m), weight 165 lb (74.844 kg), last menstrual period 07/04/2012, SpO2 98.00%. Maternal Exam:  Uterine Assessment: Contraction frequency is regular.   Abdomen: Patient reports no abdominal tenderness. Introitus:  Normal vulva. Pelvis: questionable for delivery.   Cervix: Cervix evaluated by digital exam.     Fetal Exam Fetal Monitor Review: Variability: moderate (6-25 bpm).   Pattern: accelerations present and no decelerations.    Fetal State Assessment: Category I - tracings are normal.     Physical Exam  Prenatal labs: ABO, Rh: A/POS/-- (08/13 1411) Antibody: NEG (08/13 1411) Rubella: 2.48 (08/13 1411) RPR: NON REACTIVE (02/03 2315)  HBsAg: NEGATIVE (08/13 1411)  HIV: NON REACTIVE (10/30 1003)  GBS: NEGATIVE (12/31 1326)   Assessment/Plan: [place] at term, active labor, Category 1 FHT. Admit, anticipate an NSVD   JACKSON-MOORE,Leora Platt A 04/08/2013, 1:06 PM

## 2013-04-08 NOTE — Anesthesia Procedure Notes (Signed)
Epidural Patient location during procedure: OB Start time: 04/08/2013 12:08 AM  Staffing Anesthesiologist: Jadarious Dobbins A. Performed by: anesthesiologist   Preanesthetic Checklist Completed: patient identified, site marked, surgical consent, pre-op evaluation, timeout performed, IV checked, risks and benefits discussed and monitors and equipment checked  Epidural Patient position: sitting Prep: site prepped and draped and DuraPrep Patient monitoring: continuous pulse ox and blood pressure Approach: midline Injection technique: LOR air  Needle:  Needle type: Tuohy  Needle gauge: 17 G Needle length: 9 cm and 9 Needle insertion depth: 6 cm Catheter type: closed end flexible Catheter size: 19 Gauge Catheter at skin depth: 11 cm Test dose: negative and Other  Assessment Events: blood not aspirated, injection not painful, no injection resistance, negative IV test and no paresthesia  Additional Notes Patient identified. Risks and benefits discussed including failed block, incomplete  Pain control, post dural puncture headache, nerve damage, paralysis, blood pressure Changes, nausea, vomiting, reactions to medications-both toxic and allergic and post Partum back pain. All questions were answered. Patient expressed understanding and wished to proceed. Sterile technique was used throughout procedure. Epidural site was Dressed with sterile barrier dressing. No paresthesias, signs of intravascular injection Or signs of intrathecal spread were encountered.  Patient was more comfortable after the epidural was dosed. Please see RN's note for documentation of vital signs and FHR which are stable.

## 2013-04-09 ENCOUNTER — Encounter (HOSPITAL_COMMUNITY): Payer: Self-pay | Admitting: *Deleted

## 2013-04-09 NOTE — Anesthesia Postprocedure Evaluation (Signed)
  Anesthesia Post-op Note  Patient: Meghan [name]  Procedure(s) Performed: * No procedures listed *  Patient Location: Mother/Baby  Anesthesia Type:Epidural  Level of Consciousness: awake  Airway and Oxygen Therapy: Patient Spontanous Breathing  Post-op Pain: mild  Post-op Assessment: Patient's Cardiovascular Status Stable and Respiratory Function Stable  Post-op Vital Signs: stable  Complications: No apparent anesthesia complications

## 2013-04-09 NOTE — Progress Notes (Signed)
Patient was referred for history of depression/anxiety. * Referral screened out by Clinical Social Worker because none of the following criteria appear to apply: ~ History of anxiety/depression during this pregnancy, or of post-partum depression. ~ Diagnosis of anxiety and/or depression within last 3 years ~ History of depression due to pregnancy loss/loss of child OR * Patient's symptoms currently being treated with medication and/or therapy. Please contact the Clinical Social Worker if needs arise, or if patient requests.  MOB has rx for Wellbutrin. 

## 2013-04-09 NOTE — Progress Notes (Signed)
Post Partum Day 1 Subjective: no complaints  Objective: Blood pressure 113/68, pulse 76, temperature 98 F (36.7 C), temperature source Oral, resp. rate 18, height 5\' 7"  (1.702 m), weight 165 lb (74.844 kg), last menstrual period 07/04/2012, SpO2 98.00%, unknown if currently breastfeeding.  Physical Exam:  General: alert and no distress Lochia: appropriate Uterine Fundus: firm Incision: healing well DVT Evaluation: No evidence of DVT seen on physical exam.   Recent Labs  04/07/13 2315  HGB 12.0  HCT 34.8*    Assessment/Plan: Plan for discharge tomorrow   LOS: 2 days   Meghan [name] A 04/09/2013, 8:24 AM

## 2013-04-09 NOTE — Lactation Note (Signed)
This note was copied from the chart of Meghan Caron PresumeHeather [name]. Lactation Consultation Note  Patient Name: Meghan [name] ZOXWR'UToday's Date: 04/09/2013 Reason for consult: Initial assessment Baby already latched , noted to be feeding in a consistent swallowing pattern , increased with  breast compressions. Reviewed basics with mom , importance of skin to skin, prior to latch breast massage  Hand express, latch with breast compressions and then intermittent with feeding. While baby feeding had mom  do breast compressions and mom seemed excited baby was swallowing more. Per mom comfortable. [facility] To get [facility] up . Mom concerned about having a pump .  Discussed with keeping breast feeding simple if possible by hand expressing fullness off , and [facility]  [facility] Mom aware of [facility] and the LC O/P services.    Maternal Data Formula Feeding for Exclusion: No Infant to breast within first hour of birth: Yes Does the patient have breastfeeding experience prior to this delivery?: No  Feeding Feeding Type:  (already latched, consistent pattern ) Length of feed: 26 min (consistent pattern with swallows )  LATCH Score/Interventions Latch:  (latched with depth ) Intervention(s): Teach feeding cues;Waking techniques Intervention(s): Breast compression  Audible Swallowing:  (muliplty swallows ) Intervention(s): Hand expression;Skin to skin Intervention(s): Skin to skin;Hand expression;Alternate breast massage  Type of Nipple: Everted at rest and after stimulation  Comfort (Breast/Nipple):  (per mom comfortable )     Hold (Positioning): Assistance needed to correctly position infant at breast and maintain latch. Intervention(s): Breastfeeding basics reviewed  LATCH Score: 8  Lactation Tools Discussed/Used WIC Program: No (plans to call for apt.)   Consult Status Consult Status: Follow-up Date: 04/10/13 Follow-up type:  In-patient    Kathrin Greathouseorio, Alysabeth Scalia Ann 04/09/2013, 4:20 PM

## 2013-04-09 NOTE — Progress Notes (Signed)
UR completed 

## 2013-04-10 MED ORDER — IBUPROFEN 600 MG PO TABS: 600.0000 mg | ORAL_TABLET | Freq: Four times a day (QID) | ORAL | Status: DC

## 2013-04-10 MED ORDER — SENNOSIDES-DOCUSATE SODIUM 8.6-50 MG PO TABS: 2.0000 | ORAL_TABLET | ORAL | Status: DC

## 2013-04-10 NOTE — Discharge Summary (Signed)
Obstetric Discharge Summary Reason for Admission: onset of labor Prenatal Procedures: none Intrapartum Procedures: spontaneous vaginal delivery Postpartum Procedures: none Complications-Operative and Postpartum: none and 2 degree perineal laceration Hemoglobin  Date Value Range Status  04/07/2013 12.0  12.0 - 15.0 g/dL Final     HCT  Date Value Range Status  04/07/2013 34.8* 36.0 - 46.0 % Final    Physical Exam:  General: alert and cooperative Lochia: appropriate Uterine Fundus: firm Incision: NA DVT Evaluation: No evidence of DVT seen on physical exam. Negative Homan's sign.  Discharge Diagnoses: Term Pregnancy-delivered  Discharge Information: Date: 04/10/2013 Activity: unrestricted Diet: routine Medications: Ibuprofen Condition: stable Instructions: refer to practice specific booklet Discharge to: home   Newborn Data: Live born female  Birth Weight: 6 lb 4.4 oz (2846 g) APGAR: 9, 10  Home with mother.  Nastacia Raybuck 04/10/2013, 9:59 AM

## 2013-04-10 NOTE — Lactation Note (Signed)
This note was copied from the chart of Meghan Caron PresumeHeather Chambers. Lactation Consultation Note Folow up consult:  Baby Meghan 7342 hours old.  Mother placed baby in cross cradle hold, baby latched easily, rhythmical sucking, swallows observed.  LS 9. Encouarged mother to use deep wide latch.  Reviewed engorgement care, feeding 8-12 times a day and lactation support services.  Encouraged mother to call for further assistance or questions.   Patient Name: Meghan [name] WGNFA'OToday's Date: 04/10/2013 Reason for consult: Follow-up assessment   Maternal Data Has patient been taught Hand Expression?: Yes  Feeding Feeding Type: Breast Fed Length of feed: 18 min  LATCH Score/Interventions Latch: Grasps breast easily, tongue down, lips flanged, rhythmical sucking. Intervention(s): Waking techniques Intervention(s): Breast massage  Audible Swallowing: Spontaneous and intermittent Intervention(s): Hand expression  Type of Nipple: Everted at rest and after stimulation  Comfort (Breast/Nipple): Soft / non-tender     Hold (Positioning): Assistance needed to correctly position infant at breast and maintain latch.  LATCH Score: 9  Lactation Tools Discussed/Used Tools: Pump   Consult Status Consult Status: Complete    Hardie PulleyBerkelhammer, Ruth Boschen 04/10/2013, 10:34 AM

## 2013-04-23 ENCOUNTER — Ambulatory Visit: Payer: Medicaid Other | Admitting: Obstetrics

## 2013-04-24 ENCOUNTER — Ambulatory Visit: Payer: Medicaid Other | Admitting: Obstetrics

## 2013-04-28 ENCOUNTER — Ambulatory Visit: Payer: Medicaid Other | Admitting: Obstetrics

## 2013-04-30 ENCOUNTER — Ambulatory Visit: Payer: Medicaid Other | Admitting: Obstetrics

## 2013-05-04 ENCOUNTER — Ambulatory Visit: Payer: Medicaid Other | Admitting: Obstetrics

## 2013-05-19 ENCOUNTER — Ambulatory Visit (INDEPENDENT_AMBULATORY_CARE_PROVIDER_SITE_OTHER): Payer: Medicaid Other | Admitting: Obstetrics

## 2013-05-19 ENCOUNTER — Encounter: Payer: Self-pay | Admitting: Obstetrics

## 2013-05-19 NOTE — Progress Notes (Signed)
Subjective:     Meghan [name] is a 33 y.o. female who presents for a postpartum visit. She is 6 weeks postpartum following a spontaneous vaginal delivery. I have fully reviewed the prenatal and intrapartum course. The delivery was at 39.5 gestational weeks. Outcome: spontaneous vaginal delivery. Anesthesia: epidural. Postpartum course has been WNL. Baby's course has been WNL. Baby is feeding by breast. Bleeding no bleeding. Bowel function is normal. Bladder function is normal. Patient is not sexually active. Contraception method is abstinence. Postpartum depression screening: positive.  The following portions of the patient's history were reviewed and updated as appropriate: allergies, current medications, past family history, past medical history, past social history, past surgical history and problem list.  Review of Systems Pertinent items are noted in HPI.   Objective:    BP 117/76  Pulse 84  Temp(Src) 97.9 F (36.6 C)  Ht 5\' 7"  (1.702 m)  Wt 141 lb (63.957 kg)  BMI 22.08 kg/m2  Breastfeeding? Yes  General:  alert and no distress   Assessment:     Normal postpartum exam. Pap smear not done at today's visit.   Plan:    1. Contraception: abstinence  2. Contraceptive options discussed.  Considering Nexplanon. 3. Follow up in: several weeks or as needed.

## 2013-05-26 ENCOUNTER — Ambulatory Visit (INDEPENDENT_AMBULATORY_CARE_PROVIDER_SITE_OTHER): Payer: Medicaid Other | Admitting: Obstetrics

## 2013-05-26 ENCOUNTER — Encounter: Payer: Self-pay | Admitting: Obstetrics

## 2013-05-26 DIAGNOSIS — J309 Allergic rhinitis, unspecified: Secondary | ICD-10-CM

## 2013-05-26 DIAGNOSIS — F3289 Other specified depressive episodes: Secondary | ICD-10-CM

## 2013-05-26 DIAGNOSIS — J302 Other seasonal allergic rhinitis: Secondary | ICD-10-CM

## 2013-05-26 DIAGNOSIS — Z01818 Encounter for other preprocedural examination: Secondary | ICD-10-CM | POA: Insufficient documentation

## 2013-05-26 DIAGNOSIS — K219 Gastro-esophageal reflux disease without esophagitis: Secondary | ICD-10-CM

## 2013-05-26 DIAGNOSIS — F329 Major depressive disorder, single episode, unspecified: Secondary | ICD-10-CM

## 2013-05-26 LAB — POCT URINE PREGNANCY: PREG TEST UR: NEGATIVE

## 2013-05-26 MED ORDER — SERTRALINE HCL 50 MG PO TABS: 50.0000 mg | ORAL_TABLET | Freq: Every day | ORAL | Status: DC

## 2013-05-27 ENCOUNTER — Encounter: Payer: Self-pay | Admitting: Obstetrics

## 2013-05-27 NOTE — Progress Notes (Signed)
Patient had unprotected intercourse 3 days ago.  Nexplanon insertion postponed for 2 weeks with abstinence and negative UCG's.

## 2013-06-09 ENCOUNTER — Ambulatory Visit (INDEPENDENT_AMBULATORY_CARE_PROVIDER_SITE_OTHER): Payer: Medicaid Other | Admitting: Obstetrics

## 2013-06-09 ENCOUNTER — Ambulatory Visit: Payer: Medicaid Other | Admitting: Obstetrics

## 2013-06-09 ENCOUNTER — Encounter: Payer: Self-pay | Admitting: Obstetrics

## 2013-06-09 VITALS — BP 126/82 | HR 98 | Wt 139.0 lb

## 2013-06-09 DIAGNOSIS — Z3202 Encounter for pregnancy test, result negative: Secondary | ICD-10-CM

## 2013-06-09 DIAGNOSIS — Z01818 Encounter for other preprocedural examination: Secondary | ICD-10-CM

## 2013-06-09 DIAGNOSIS — Z30017 Encounter for initial prescription of implantable subdermal contraceptive: Secondary | ICD-10-CM

## 2013-06-09 LAB — POCT URINE PREGNANCY: Preg Test, Ur: NEGATIVE

## 2013-06-09 NOTE — Progress Notes (Signed)
NEXPLANON INSERTION NOTE  Date of LMP:   None  Contraception used: Abstinence  Pregnancy test result:  Lab Results  Component Value Date   PREGTESTUR Negative 05/26/2013    Indications:  The patient desires contraception.  She understands risks, benefits, and alternatives to Implanon and would like to proceed.  Anesthesia:   Lidocaine 1% plain.  Procedure:  A time-out was performed confirming the procedure and the patient's allergy status.  The patient's non-dominant was identified as the left arm.  The protection cap was removed. While placing countertraction on the skin, the needle was inserted at a 30 degree angle.  The applicator was held horizontal to the skin; the skin was tented upward as the needle was introduced into the subdermal space.  While holding the applicator in place, the slider was unlocked. The Nexplanon was removed from the field.  The Nexplanon was palpated to ensure proper placement.  EXP:  09 / 2017 LOT:  [id number]:  [id number] / [id number]  Complications: None  Instructions:  The patient was instructed to remove the dressing in 24 hours and that some bruising is to be expected.  She was advised to use over the counter analgesics as needed for any pain at the site.  She is to keep the area dry for 24 hours and to call if her hand or arm becomes cold, numb, or blue.  Return visit:  Return in 2 weeks

## 2013-06-16 ENCOUNTER — Encounter: Payer: Self-pay | Admitting: Obstetrics

## 2013-08-13 IMAGING — US US OB COMP LESS 14 WK
1 series · 14 of 21 positions shown · non-contrast
Comparison: None.

CLINICAL DATA: cramping and spotting,

OBSTETRIC <14 WK US
TECHNIQUE: Transabdominal ultrasound examination was performed for
complete evaluation of the gestation as well as the maternal
uterus, adnexal regions, and pelvic cul-de-sac.

[Series 1: us ob comp less 14 wks · 21 acquisitions, 14 frames shown]
[im 1/21]
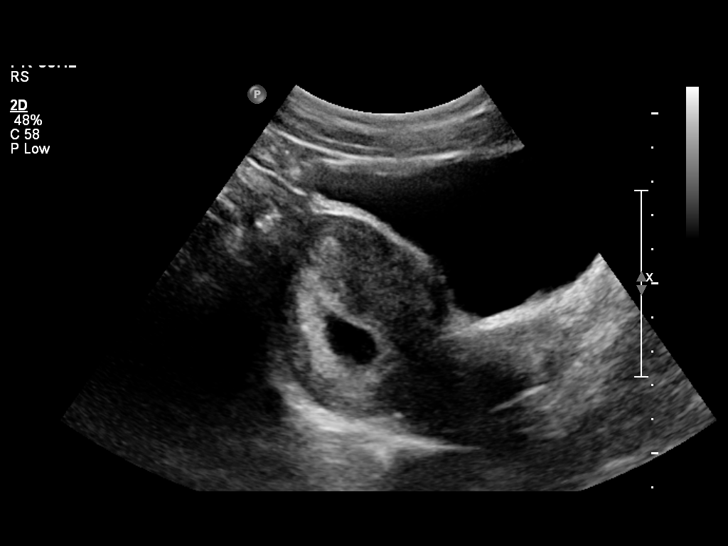
[im 3/21]
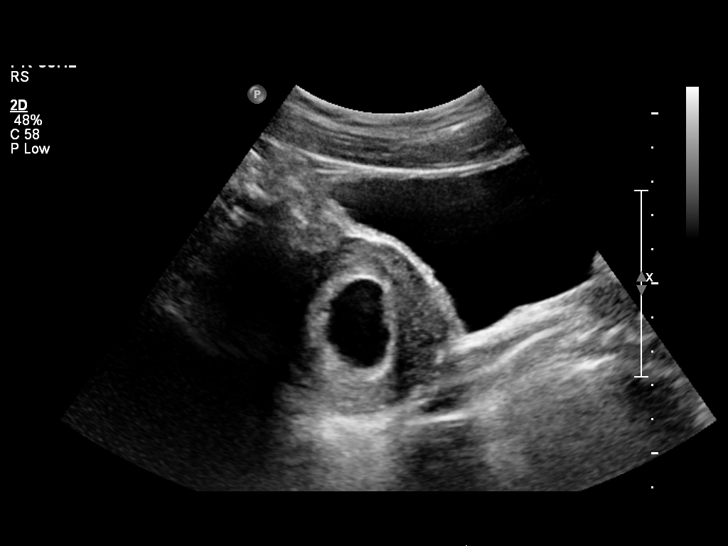
[im 4/21]
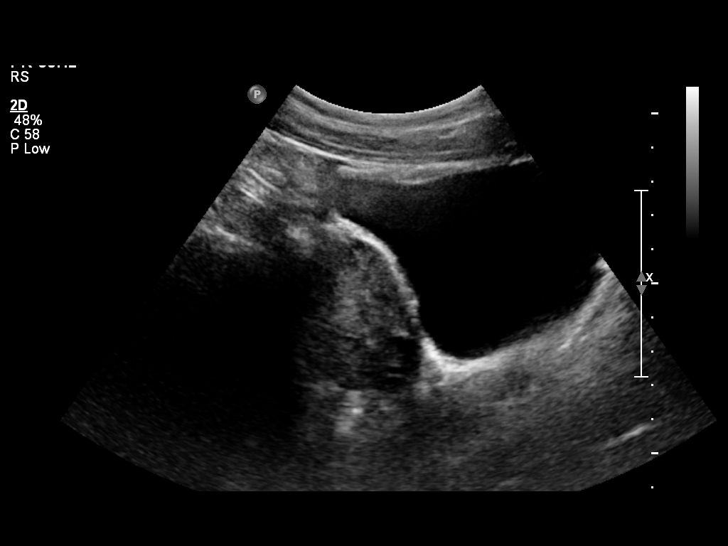
[im 6/21]
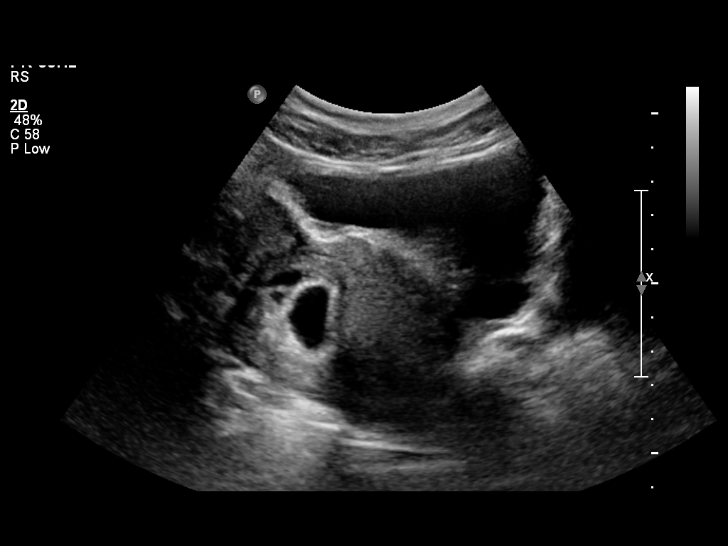
[im 7/21]
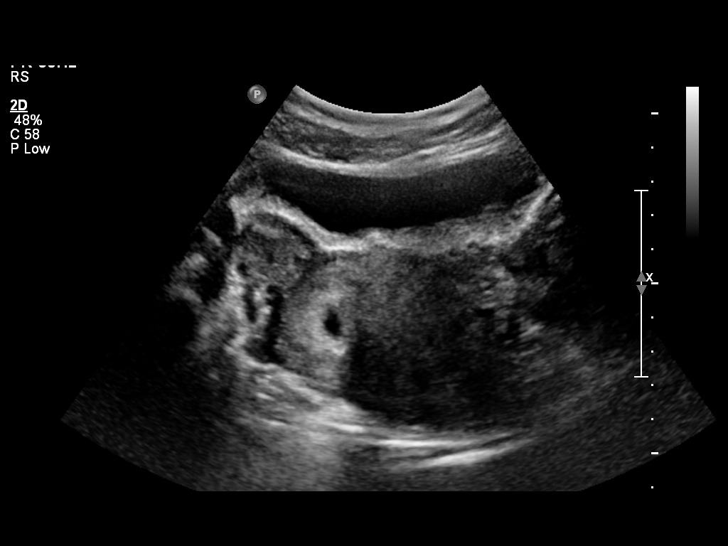
[im 9/21]
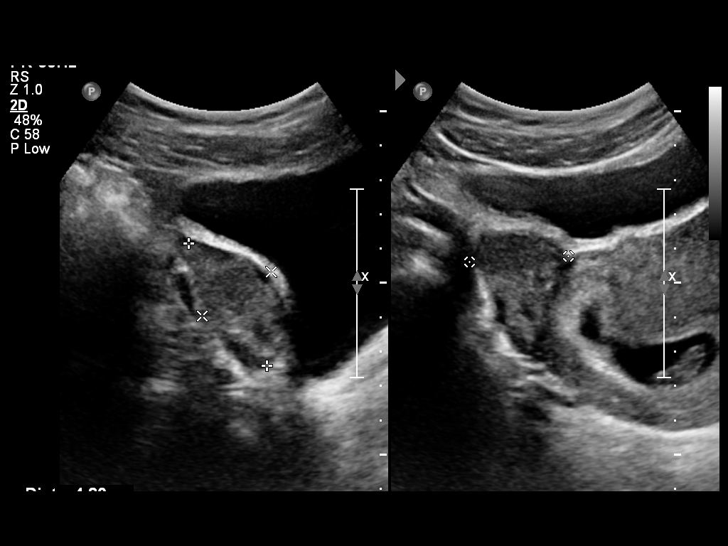
[im 10/21]
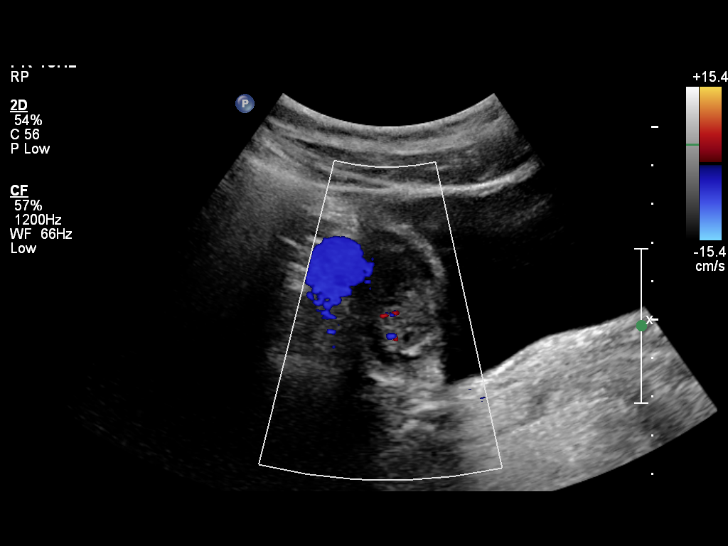
[im 12/21]
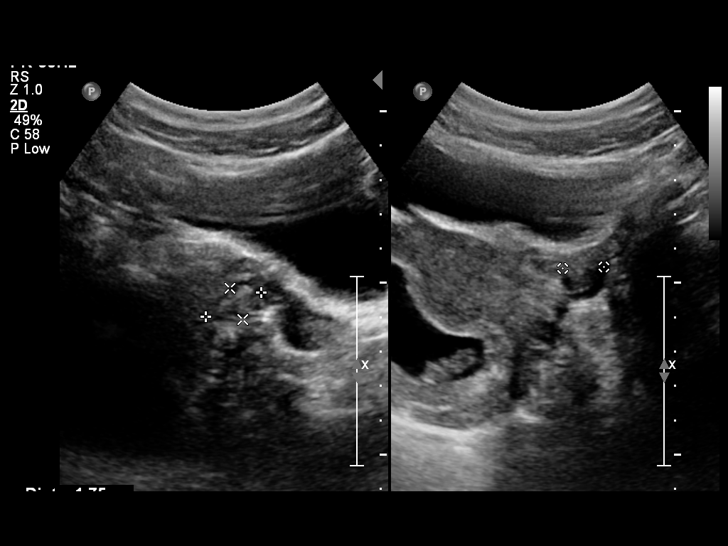
[im 13/21]
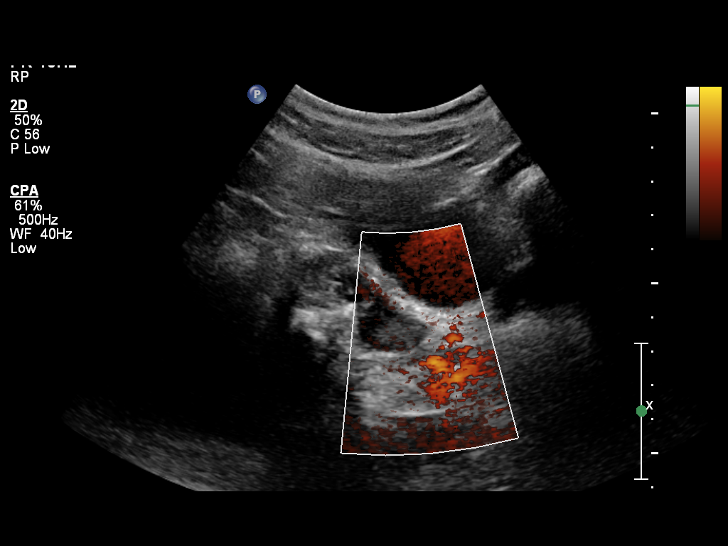
[im 15/21]
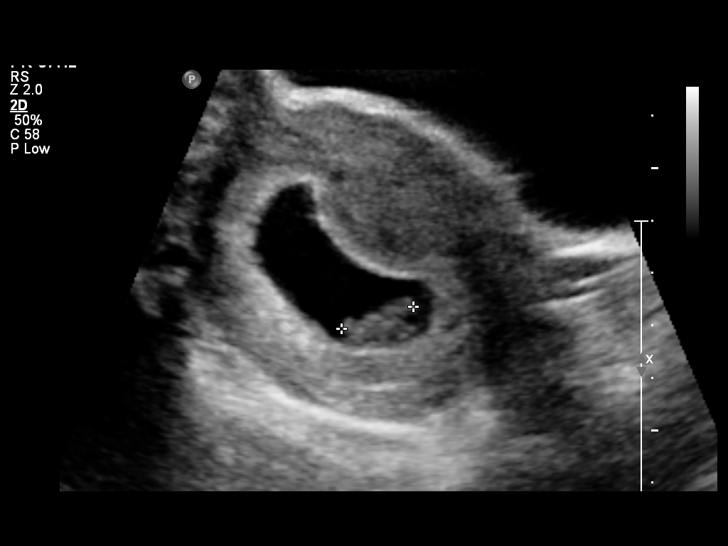
[im 16/21]
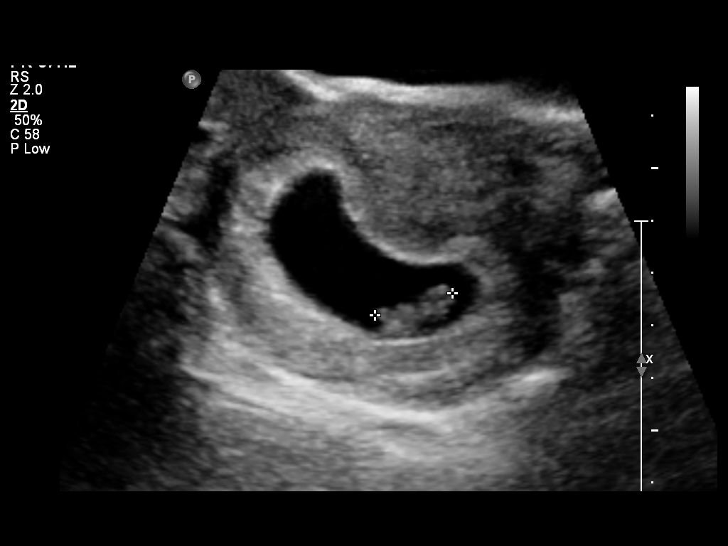
[im 18/21]
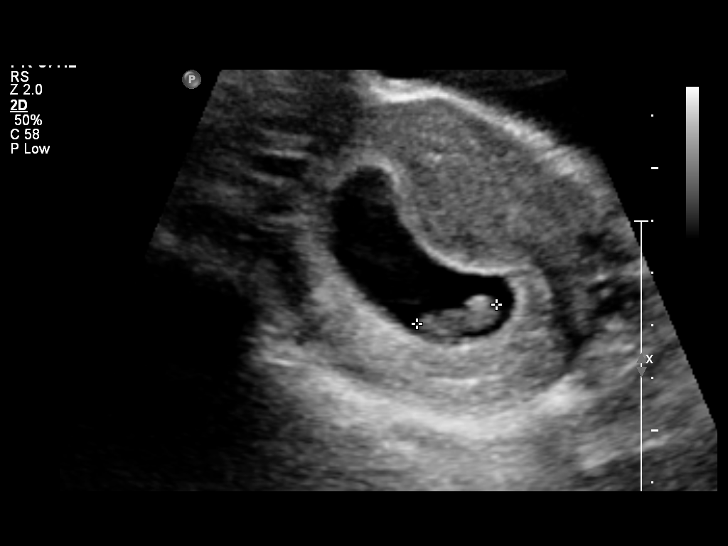
[im 19/21]
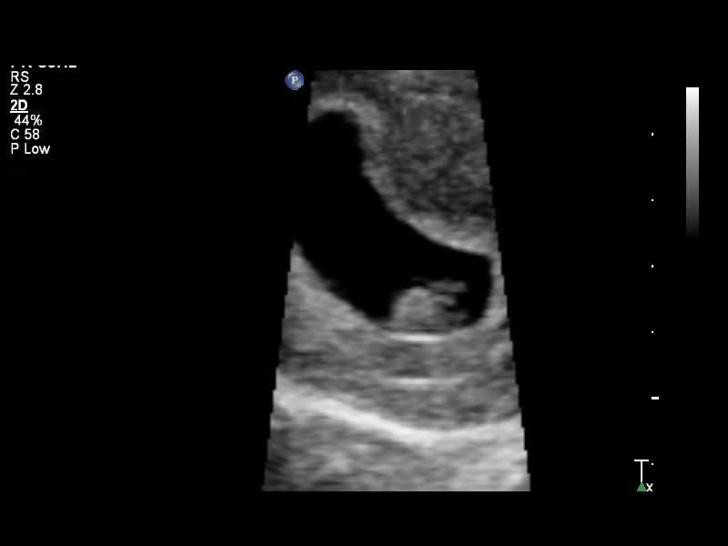
[im 21/21]
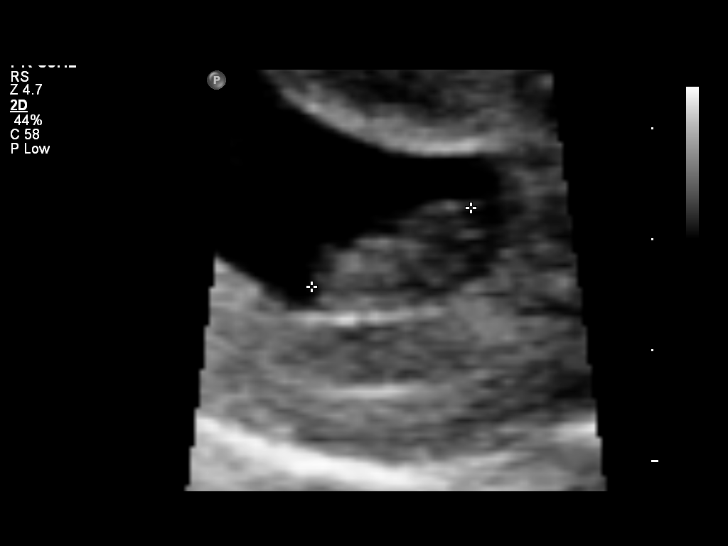

[14 of 21 positions shown; findings below may reference images not displayed]

FINDINGS: There is a single intrauterine gestation.  Crown-rump
length is 1.63 cm for an estimated gestational age of 8 weeks 1
day.  Fetal heart rate 174 beats per minute.  No subchorionic
hemorrhage.

Ovaries are symmetric in size and echotexture.  No adnexal masses.
No free fluid.
IMPRESSION: 8-week-8-day intrauterine pregnancy.  Fetal heart rate 174 beats
per minute.

## 2014-01-04 ENCOUNTER — Encounter: Payer: Self-pay | Admitting: Obstetrics

## 2017-10-27 ENCOUNTER — Encounter (HOSPITAL_COMMUNITY): Payer: Self-pay | Admitting: *Deleted

## 2017-10-27 ENCOUNTER — Ambulatory Visit (HOSPITAL_COMMUNITY)
Admission: EM | Admit: 2017-10-27 | Discharge: 2017-10-27 | Disposition: A | Discharge status: Home / Self Care | Payer: Self-pay | Attending: Physician Assistant | Admitting: Physician Assistant

## 2017-10-27 DIAGNOSIS — L509 Urticaria, unspecified: Secondary | ICD-10-CM

## 2017-10-27 DIAGNOSIS — T7840XA Allergy, unspecified, initial encounter: Secondary | ICD-10-CM

## 2017-10-27 MED ORDER — PREDNISONE 20 MG PO TABS: ORAL_TABLET | ORAL | 0 refills | Status: AC

## 2017-10-27 MED ORDER — METHYLPREDNISOLONE SODIUM SUCC 125 MG IJ SOLR
INTRAMUSCULAR | Status: AC
  Filled 2017-10-27: qty 2

## 2017-10-27 MED ORDER — CETIRIZINE HCL 10 MG PO CAPS: 10.0000 mg | ORAL_CAPSULE | Freq: Every day | ORAL | 0 refills | Status: DC

## 2017-10-27 MED ORDER — DIPHENHYDRAMINE HCL 50 MG/ML IJ SOLN
25.0000 mg | Freq: Once | INTRAMUSCULAR | Status: AC
  Administered 2017-10-27: 25 mg via INTRAMUSCULAR

## 2017-10-27 MED ORDER — METHYLPREDNISOLONE SODIUM SUCC 125 MG IJ SOLR
125.0000 mg | Freq: Once | INTRAMUSCULAR | Status: AC
  Administered 2017-10-27: 125 mg via INTRAMUSCULAR

## 2017-10-27 MED ORDER — DIPHENHYDRAMINE HCL 50 MG/ML IJ SOLN
INTRAMUSCULAR | Status: AC
  Filled 2017-10-27: qty 1

## 2017-10-27 NOTE — ED Triage Notes (Signed)
Reports urticaria since this AM.  Unk cause.  Has taken Benadryl.

## 2017-10-27 NOTE — Discharge Instructions (Addendum)
Start the prednisone orally tomorrow.  Start the zyrtec tonight.

## 2017-10-27 NOTE — ED Notes (Signed)
On discharge felt light headed.  Clark, pa aware remained in attendance of patient until she left building.  Patient was offered sprite and opportunity to sit.  Patient felt better on discharge and spouse with patient

## 2017-10-27 NOTE — ED Provider Notes (Signed)
10/27/2017 7:47 PM   DOB: 11/22/1980 / MRN: [id number]  SUBJECTIVE:  Meghan [name] is a 37 y.o. female presenting for hives that started today.  Assoicates pruritis.  Denies fever, diaphoresis.  Has tried benadryl with mild relief.  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 Husband ingested bee pollen yesterday and they had sex last night.   She is allergic to shellfish allergy and bee venom.   She  has no past medical history on file.    She  reports that she has been smoking cigarettes. She has been smoking about 1.00 pack per day. She has never used smokeless tobacco. She reports that she does not drink alcohol or use drugs. She  has no sexual activity history on file. The patient  has a past surgical history that includes Fracture surgery and Laceration repair (11/28/2011).  Her family history includes COPD in her father; Cancer in her mother; Diabetes in her paternal grandmother; Heart disease in her paternal grandfather; Hypertension in her mother; Liver disease in her father; Stroke in her maternal aunt.  Review of Systems  Constitutional: Negative for chills, diaphoresis and fever.  Eyes: Negative.   Respiratory: Negative for cough, hemoptysis, sputum production, shortness of breath and wheezing.   Cardiovascular: Negative for chest pain, orthopnea and leg swelling.  Gastrointestinal: Negative for abdominal pain, blood in stool, constipation, diarrhea, heartburn, melena, nausea and vomiting.  Genitourinary: Negative for dysuria, flank pain, frequency, hematuria and urgency.  Skin: Positive for itching and rash.  Neurological: Negative for dizziness, sensory change, speech change, focal weakness and headaches.    OBJECTIVE:  BP 124/87   Pulse 77   Temp 97.9 F (36.6 C)   Resp 16   LMP 10/21/2017 (Exact Date)   SpO2 100%   Breastfeeding? No   Wt Readings from Last 3 Encounters:  06/09/13 139 lb (63 kg)  05/26/13 140 lb (63.5 kg)  05/19/13 141 lb (64 kg)   Temp  Readings from Last 3 Encounters:  10/27/17 97.9 F (36.6 C)  05/19/13 97.9 F (36.6 C)  04/10/13 98.3 F (36.8 C) (Oral)   BP Readings from Last 3 Encounters:  10/27/17 124/87  06/09/13 126/82  05/26/13 112/72   Pulse Readings from Last 3 Encounters:  10/27/17 77  06/09/13 98  05/26/13 75    Physical Exam  Constitutional: She is oriented to person, place, and time. She appears well-nourished. No distress.  HENT:  Mouth/Throat: No oropharyngeal exudate.  Eyes: Pupils are equal, round, and reactive to light. EOM are normal.  Cardiovascular: Normal rate.  Pulmonary/Chest: Effort normal.  Abdominal: She exhibits no distension.  Neurological: She is alert and oriented to person, place, and time. No cranial nerve deficit. Gait normal.  Skin: Skin is dry. Rash (Generalized hives. ) noted. She is not diaphoretic.  Psychiatric: She has a normal mood and affect.  Vitals reviewed.   No results found for this or any previous visit (from the past 72 hour(s)).  No results found.  ASSESSMENT AND PLAN:   Hives  Allergic reaction, initial encounter    Discharge Instructions     Start the prednisone orally tomorrow.  Start the zyrtec tonight.         The patient is advised to call or return to clinic if she does not see an improvement in symptoms, or to seek the care of the closest emergency department if she worsens with the above plan.   Deliah BostonMichael Clark, MHS, PA-C 10/27/2017 7:47 PM   Chestine Sporelark,  Marolyn Hammock, PA-C 10/27/17 1947

## 2019-03-13 ENCOUNTER — Ambulatory Visit: Payer: Self-pay | Attending: Internal Medicine

## 2019-03-13 DIAGNOSIS — Z20822 Contact with and (suspected) exposure to covid-19: Secondary | ICD-10-CM | POA: Insufficient documentation

## 2019-03-15 LAB — NOVEL CORONAVIRUS, NAA: SARS-CoV-2, NAA: NOT DETECTED

## 2021-09-29 ENCOUNTER — Emergency Department (HOSPITAL_COMMUNITY): Payer: BC Managed Care – PPO

## 2021-09-29 ENCOUNTER — Other Ambulatory Visit: Payer: Self-pay

## 2021-09-29 ENCOUNTER — Encounter (HOSPITAL_COMMUNITY): Payer: Self-pay | Admitting: Emergency Medicine

## 2021-09-29 ENCOUNTER — Emergency Department (HOSPITAL_COMMUNITY)
Admission: EM | Admit: 2021-09-29 | Discharge: 2021-09-30 | Disposition: A | Discharge status: Home / Self Care | Payer: BC Managed Care – PPO | Attending: Emergency Medicine | Admitting: Emergency Medicine

## 2021-09-29 DIAGNOSIS — F419 Anxiety disorder, unspecified: Secondary | ICD-10-CM | POA: Insufficient documentation

## 2021-09-29 DIAGNOSIS — R072 Precordial pain: Secondary | ICD-10-CM | POA: Diagnosis present

## 2021-09-29 DIAGNOSIS — Z20822 Contact with and (suspected) exposure to covid-19: Secondary | ICD-10-CM | POA: Insufficient documentation

## 2021-09-29 DIAGNOSIS — F410 Panic disorder [episodic paroxysmal anxiety] without agoraphobia: Secondary | ICD-10-CM

## 2021-09-29 LAB — CBC
HCT: 38.4 % (ref 36.0–46.0)
Hemoglobin: 13.1 g/dL (ref 12.0–15.0)
MCH: 31.9 pg (ref 26.0–34.0)
MCHC: 34.1 g/dL (ref 30.0–36.0)
MCV: 93.4 fL (ref 80.0–100.0)
Platelets: 279 10*3/uL (ref 150–400)
RBC: 4.11 MIL/uL (ref 3.87–5.11)
RDW: 13.1 % (ref 11.5–15.5)
WBC: 11.1 10*3/uL — ABNORMAL HIGH (ref 4.0–10.5)
nRBC: 0 % (ref 0.0–0.2)

## 2021-09-29 LAB — I-STAT BETA HCG BLOOD, ED (MC, WL, AP ONLY): I-stat hCG, quantitative: <5 m[IU]/mL (ref <5)

## 2021-09-29 NOTE — ED Triage Notes (Signed)
Patient arrived with EMS from home reports central chest pain with headache , lightheaded and generalized weakness this evening . CBG= 118.

## 2021-09-30 DIAGNOSIS — F419 Anxiety disorder, unspecified: Secondary | ICD-10-CM | POA: Diagnosis not present

## 2021-09-30 LAB — BASIC METABOLIC PANEL
Anion gap: 11 (ref 5–15)
BUN: 11 mg/dL (ref 6–20)
CO2: 20 mmol/L — ABNORMAL LOW (ref 22–32)
Calcium: 8.5 mg/dL — ABNORMAL LOW (ref 8.9–10.3)
Chloride: 108 mmol/L (ref 98–111)
Creatinine, Ser: 0.73 mg/dL (ref 0.44–1.00)
GFR, Estimated: >60 mL/min (ref >60)
Glucose, Bld: 95 mg/dL (ref 70–99)
Potassium: 3.4 mmol/L — ABNORMAL LOW (ref 3.5–5.1)
Sodium: 139 mmol/L (ref 135–145)

## 2021-09-30 LAB — SARS CORONAVIRUS 2 BY RT PCR: SARS Coronavirus 2 by RT PCR: NEGATIVE

## 2021-09-30 LAB — TROPONIN I (HIGH SENSITIVITY)
Troponin I (High Sensitivity): 5 ng/L (ref <18)
Troponin I (High Sensitivity): 5 ng/L (ref <18)

## 2021-09-30 MED ORDER — HYDROXYZINE HCL 10 MG PO TABS: 10.0000 mg | ORAL_TABLET | Freq: Three times a day (TID) | ORAL | 0 refills | Status: DC | PRN

## 2021-09-30 MED ORDER — HYDROXYZINE HCL 10 MG PO TABS
10.0000 mg | ORAL_TABLET | Freq: Once | ORAL | Status: AC
  Administered 2021-09-30: 10 mg via ORAL
  Filled 2021-09-30: qty 1

## 2021-09-30 NOTE — ED Notes (Signed)
Patient verbalizes understanding of discharge instructions. Opportunity for questioning and answers were provided. Armband removed by staff, pt discharged from ED. Wheeled out to lobby with sister

## 2021-09-30 NOTE — ED Provider Notes (Incomplete)
[facility] EMERGENCY DEPARTMENT Provider Note   CSN: [id number] Arrival date & time: 09/29/21  2202     History {Add pertinent medical, surgical, social history, OB history to HPI:1} Chief Complaint  Patient presents with   Chest Pain     Meghan [name] is a 41 y.o. female.  The history is provided by the patient.  Chest Pain Pain location:  Substernal area Pain radiates to:  Does not radiate Pain severity:  Moderate Onset quality:  Sudden Duration: hours. Timing:  Constant Progression:  Unchanged Chronicity:  New Context: at rest   Relieved by:  Nothing Worsened by:  Nothing Ineffective treatments:  None tried Associated symptoms: anxiety   Associated symptoms: no cough, no numbness, no orthopnea, no palpitations, no syncope and no weakness        Home Medications Prior to Admission medications   Medication Sig Start Date End Date Taking? Authorizing Provider  hydrOXYzine (ATARAX) 10 MG tablet Take 1 tablet (10 mg total) by mouth every 8 (eight) hours as needed for anxiety. 09/30/21  Yes Marven Veley, MD  Cetirizine HCl (ZYRTEC ALLERGY) 10 MG CAPS Take 1 capsule (10 mg total) by mouth at bedtime. 10/27/17   Ofilia Neas, PA-C      Allergies    Shellfish allergy and Bee venom    Review of Systems   Review of Systems  Respiratory:  Negative for cough.   Cardiovascular:  Positive for chest pain. Negative for palpitations, orthopnea and syncope.  Neurological:  Negative for weakness and numbness.    Physical Exam Updated Vital Signs BP 119/82   Pulse 81   Temp 99.1 F (37.3 C) (Oral)   Resp 16   SpO2 98%  Physical Exam  ED Results / Procedures / Treatments   Labs (all labs ordered are listed, but only abnormal results are displayed) Labs Reviewed  BASIC METABOLIC PANEL - Abnormal; Notable for the following components:      Result Value   Potassium 3.4 (*)    CO2 20 (*)    Calcium 8.5 (*)    All other components within  normal limits  CBC - Abnormal; Notable for the following components:   WBC 11.1 (*)    All other components within normal limits  SARS CORONAVIRUS 2 BY RT PCR  I-STAT BETA HCG BLOOD, ED ([facility], [facility], [facility] ONLY)  TROPONIN I (HIGH SENSITIVITY)  TROPONIN I (HIGH SENSITIVITY)    EKG None  Radiology DG Chest 2 View  Result Date: 09/29/2021 CLINICAL DATA:  Chest pain EXAM: CHEST - 2 VIEW COMPARISON:  None Available. FINDINGS: The heart size and mediastinal contours are within normal limits. Both lungs are clear. The visualized skeletal structures are unremarkable. IMPRESSION: No active cardiopulmonary disease. Electronically Signed   By: Alcide Clever M.D.   On: 09/29/2021 23:20    Procedures Procedures  {Document cardiac monitor, telemetry assessment procedure when appropriate:1}  Medications Ordered in ED Medications  hydrOXYzine (ATARAX) tablet 10 mg (10 mg Oral Given 09/30/21 0241)    ED Course/ Medical Decision Making/ A&P                           Medical Decision Making Amount and/or Complexity of Data Reviewed Labs: ordered. Radiology: ordered.  Risk Prescription drug management.   ***  {Document critical care time when appropriate:1} {Document review of labs and clinical decision tools ie heart score, Chads2Vasc2 etc:1}  {Document your independent review of  radiology images, and any outside records:1} {Document your discussion with family members, caretakers, and with consultants:1} {Document social determinants of health affecting pt's care:1} {Document your decision making why or why not admission, treatments were needed:1} Final Clinical Impression(s) / ED Diagnoses Final diagnoses:  Anxiety attack    Rx / DC Orders ED Discharge Orders          Ordered    hydrOXYzine (ATARAX) 10 MG tablet  Every 8 hours PRN        09/30/21 0257

## 2022-01-10 ENCOUNTER — Other Ambulatory Visit: Payer: Self-pay | Admitting: Internal Medicine

## 2022-01-10 ENCOUNTER — Ambulatory Visit (INDEPENDENT_AMBULATORY_CARE_PROVIDER_SITE_OTHER): Payer: Medicaid Other

## 2022-01-10 VITALS — BP 134/98 | HR 86 | Temp 98.2°F | Wt 178.6 lb

## 2022-01-10 DIAGNOSIS — R030 Elevated blood-pressure reading, without diagnosis of hypertension: Secondary | ICD-10-CM | POA: Diagnosis not present

## 2022-01-10 DIAGNOSIS — Z3046 Encounter for surveillance of implantable subdermal contraceptive: Secondary | ICD-10-CM

## 2022-01-10 DIAGNOSIS — Z309 Encounter for contraceptive management, unspecified: Secondary | ICD-10-CM | POA: Insufficient documentation

## 2022-01-10 DIAGNOSIS — F411 Generalized anxiety disorder: Secondary | ICD-10-CM

## 2022-01-10 DIAGNOSIS — F418 Other specified anxiety disorders: Secondary | ICD-10-CM

## 2022-01-10 MED ORDER — BUSPIRONE HCL 5 MG PO TABS: 5.0000 mg | ORAL_TABLET | Freq: Three times a day (TID) | ORAL | 0 refills | Status: DC

## 2022-01-10 MED ORDER — ESCITALOPRAM OXALATE 10 MG PO TABS: 10.0000 mg | ORAL_TABLET | Freq: Every day | ORAL | 1 refills | Status: DC

## 2022-01-10 NOTE — Assessment & Plan Note (Signed)
Patient endorses long standing anxiety, for which she was most likely seen in the ED 7/28 after thinking she was having a stroke and found to have a panic attack. At that time she was given 10 days of low dose hydroxyzine which did not help. She reports long standing episodes of tachycardia, flutter in her chest, dizziness,neck tension, and spiral thinking in regards to money and when thinking about her child. She denies headache, CP,SOB,N/V,abdominal pain or diarrhea. She has started avoiding leaving the house unecissarily. There are no clear triggers and at times she says she just wakes up feeling this way. She also endorses intermittent sleep changes, anhedonia, low energy, decreased appetite. She denies SI/HI. Her PHQ 9 was 21 and GAD 7 19. Gave her the number for [facility] to get scheduled for therapy. Instructed her to call back if significant wait time. -lexipro 10 mg daily -buspirone 5mg  TID

## 2022-01-10 NOTE — Progress Notes (Signed)
New Patient Office Visit  Subjective    Patient ID: Meghan [name], female    DOB: November 25, 1980  Age: 41 y.o. MRN: [id number]  CC: anxiety   HPI Meghan [name] is a 41 y/o female with a pmh outlined below who presents to establish care. Please see encounter tab for HPI and A/P information.  Outpatient Encounter Medications as of 01/10/2022  Medication Sig   busPIRone (BUSPAR) 5 MG tablet Take 1 tablet (5 mg total) by mouth 3 (three) times daily.   escitalopram (LEXAPRO) 10 MG tablet Take 1 tablet (10 mg total) by mouth daily.   Cetirizine HCl (ZYRTEC ALLERGY) 10 MG CAPS Take 1 capsule (10 mg total) by mouth at bedtime.   [DISCONTINUED] hydrOXYzine (ATARAX) 10 MG tablet Take 1 tablet (10 mg total) by mouth every 8 (eight) hours as needed for anxiety.   No facility-administered encounter medications on file as of 01/10/2022.    No past medical history on file.  Past Surgical History:  Procedure Laterality Date   FRACTURE SURGERY     LACERATION REPAIR  11/28/2011   Procedure: REPAIR MULTIPLE LACERATIONS;  Surgeon: Wayland Denis, DO;  Location: [facility] OR;  Service: Plastics;  Laterality: N/A;    Family History  Problem Relation Age of Onset   Stroke Maternal Aunt    Cancer Mother    Hypertension Mother    COPD Father    Liver disease Father    Diabetes Paternal Grandmother    Heart disease Paternal Grandfather     Social History   Socioeconomic History   Marital status: Single    Spouse name: Not on file   Number of children: Not on file   Years of education: Not on file   Highest education level: Not on file  Occupational History   Not on file  Tobacco Use   Smoking status: Every Day    Packs/day: 1.00    Types: Cigarettes   Smokeless tobacco: Never  Substance and Sexual Activity   Alcohol use: No   Drug use: No   Sexual activity: Not on file  Other Topics Concern   Not on file  Social History Narrative   Not on file   Social Determinants of Health    Financial Resource Strain: Not on file  Food Insecurity: Not on file  Transportation Needs: Not on file  Physical Activity: Not on file  Stress: Not on file  Social Connections: Not on file  Intimate Partner Violence: Not on file    Review of Systems  All other systems reviewed and are negative.       Objective    BP (!) 134/98 (BP Location: Left Arm, Patient Position: Sitting, Cuff Size: Normal)   Pulse 86   Temp 98.2 F (36.8 C) (Oral)   Wt 178 lb 9.6 oz (81 kg)   SpO2 100%   BMI 27.97 kg/m   Physical Exam Constitutional:      General: She is not in acute distress.    Appearance: Normal appearance. She is not ill-appearing.  Eyes:     General: No scleral icterus.    Conjunctiva/sclera: Conjunctivae normal.  Cardiovascular:     Rate and Rhythm: Normal rate and regular rhythm.     Pulses: Normal pulses.     Heart sounds: Normal heart sounds. No murmur heard.    No friction rub. No gallop.  Pulmonary:     Effort: Pulmonary effort is normal. No respiratory distress.     Breath sounds:  Normal breath sounds. No wheezing or rales.  Musculoskeletal:     Right lower leg: No edema.     Left lower leg: No edema.  Skin:    General: Skin is warm and dry.     Capillary Refill: Capillary refill takes less than 2 seconds.     Coloration: Skin is not jaundiced.     Findings: No rash.  Neurological:     Mental Status: She is alert.  Psychiatric:        Mood and Affect: Mood normal.        Behavior: Behavior normal.        Thought Content: Thought content normal.        Judgment: Judgment normal.         Assessment & Plan:   Problem List Items Addressed This Visit       Other   Anxiety with depression    Patient endorses long standing anxiety, for which she was most likely seen in the ED 7/28 after thinking she was having a stroke and found to have a panic attack. At that time she was given 10 days of low dose hydroxyzine which did not help. She reports long  standing episodes of tachycardia, flutter in her chest, dizziness,neck tension, and spiral thinking in regards to money and when thinking about her child. She denies headache, CP,SOB,N/V,abdominal pain or diarrhea. She has started avoiding leaving the house unecissarily. There are no clear triggers and at times she says she just wakes up feeling this way. She also endorses intermittent sleep changes, anhedonia, low energy, decreased appetite. She denies SI/HI. Her PHQ 9 was 21 and GAD 7 19. Gave her the number for [facility] to get scheduled for therapy. Instructed her to call back if significant wait time. -lexipro 10 mg daily -buspirone 5mg  TID       Relevant Medications   escitalopram (LEXAPRO) 10 MG tablet   busPIRone (BUSPAR) 5 MG tablet   Contraception management    Patient has had the same nexplanon since 2015. She wishes to continue with this and will need replacement at her next visit. Will also perform a pap smear at this time.      Elevated blood pressure reading   Other Visit Diagnoses     GAD (generalized anxiety disorder)    -  Primary   Relevant Medications   escitalopram (LEXAPRO) 10 MG tablet   busPIRone (BUSPAR) 5 MG tablet       No follow-ups on file.   2016, MD

## 2022-01-10 NOTE — Progress Notes (Deleted)
Anxiety attack 09/29/2021 went to ED: CBC with wbc to 11.1 at that time, BMP with k to 3.4 and calcium to 8.5  Nexplanon inserted 2015    Cetirizine Hydroxyzine  HCM Pap Flu Hcv  Reschedule when dr Sol Blazing is on clinic  Phq9 21  Gad 7 19

## 2022-01-10 NOTE — Patient Instructions (Signed)
Thank you, Ms.Myrtie Neither for allowing [place] to provide your care today. Today we discussed :  Anxiety/depression- We will start you on escitalopram 10mg  daily. This may take 4-6 weeks to take full effect. Some folks report mild headaches and a little GI upset initially. If tolerating well we can go up on the dose in  few weeks. I have also prescribed buspirone to help control symptoms in the mean time. Please cal [facility] [phone] ) and see how long it will take to get in for counseling. Please call our office and let [phone] know if it is too long of a wait.  High blood pressure- We will continue to monitor this.  Nexplanon contraception implant- We will retreive this and place a new one at your next visit. We will also get your pap smear done.      I have ordered the following medication/changed the following medications:   Stop the following medications: There are no discontinued medications.   Start the following medications: Meds ordered this encounter  Medications   escitalopram (LEXAPRO) 10 MG tablet    Sig: Take 1 tablet (10 mg total) by mouth daily.    Dispense:  30 tablet    Refill:  1   busPIRone (BUSPAR) 5 MG tablet    Sig: Take 1 tablet (5 mg total) by mouth 3 (three) times daily.    Dispense:  90 tablet    Refill:  0     Follow up:  1 month      We look forward to seeing you next time. Please call our clinic at [phone] if you have any questions or concerns. The best time to call is Monday-Friday from 9am-4pm, but there is someone available 24/7. If after hours or the weekend, call the main hospital number and ask for the Internal Medicine Resident On-Call. If you need medication refills, please notify your pharmacy one week in advance and they will send [phone] a request.   Thank you for trusting me with your care. Wishing you the best!   [place], MD [facility]

## 2022-01-10 NOTE — Assessment & Plan Note (Signed)
>>  ASSESSMENT AND PLAN FOR ANXIETY WITH DEPRESSION WRITTEN ON 01/10/2022  9:17 PM BY Willette Cluster, MD  Patient endorses long standing anxiety, for which she was most likely seen in the ED 7/28 after thinking she was having a stroke and found to have a panic attack. At that time she was given 10 days of low dose hydroxyzine which did not help. She reports long standing episodes of tachycardia, flutter in her chest, dizziness,neck tension, and spiral thinking in regards to money and when thinking about her child. She denies headache, CP,SOB,N/V,abdominal pain or diarrhea. She has started avoiding leaving the house unecissarily. There are no clear triggers and at times she says she just wakes up feeling this way. She also endorses intermittent sleep changes, anhedonia, low energy, decreased appetite. She denies SI/HI. Her PHQ 9 was 21 and GAD 7 19. Gave her the number for [facility] to get scheduled for therapy. Instructed her to call back if significant wait time. -lexipro 10 mg daily -buspirone 5mg  TID

## 2022-01-10 NOTE — Assessment & Plan Note (Signed)
BP elevated to 155/92, at 134/98 on repeat. No history of HTN and no evidence based on vital trends, but she has had very little follow up since 2015. Continue to monitor and will need to treat if persistently high at next few visits.

## 2022-01-10 NOTE — Assessment & Plan Note (Signed)
Patient has had the same nexplanon since 2015. She wishes to continue with this and will need replacement at her next visit. Will also perform a pap smear at this time.

## 2022-01-16 NOTE — Progress Notes (Signed)
Internal Medicine Clinic Attending  Case discussed with Dr. Aundria Rud  At the time of the visit.  We reviewed the resident's history and exam and pertinent patient test results.  I agree with the assessment, diagnosis, and plan of care documented in the resident's note.  We will coordinate a time for her f/u visit to allow adequate opportunity for her procedure and for pap. New info - [facility] has a several month wait list for therapist so pt will need another option.  Will mssg referral coordinator.

## 2022-01-17 NOTE — Addendum Note (Signed)
Addended by: Suheily Birks on: 01/17/2022 09:42 AM   Modules accepted: Orders  

## 2022-02-07 ENCOUNTER — Ambulatory Visit (INDEPENDENT_AMBULATORY_CARE_PROVIDER_SITE_OTHER): Payer: Medicaid Other | Admitting: Mental Health

## 2022-02-07 ENCOUNTER — Encounter (HOSPITAL_COMMUNITY): Payer: Self-pay

## 2022-02-07 DIAGNOSIS — F322 Major depressive disorder, single episode, severe without psychotic features: Secondary | ICD-10-CM

## 2022-02-07 DIAGNOSIS — F411 Generalized anxiety disorder: Secondary | ICD-10-CM

## 2022-02-08 ENCOUNTER — Encounter: Payer: Medicaid Other | Admitting: Student

## 2022-02-08 ENCOUNTER — Other Ambulatory Visit: Payer: Self-pay

## 2022-02-08 DIAGNOSIS — F411 Generalized anxiety disorder: Secondary | ICD-10-CM | POA: Insufficient documentation

## 2022-02-08 DIAGNOSIS — F322 Major depressive disorder, single episode, severe without psychotic features: Secondary | ICD-10-CM | POA: Insufficient documentation
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
## 2022-02-08 NOTE — Telephone Encounter (Signed)
Pt had an appt today but did not come.

## 2022-03-08 ENCOUNTER — Ambulatory Visit (INDEPENDENT_AMBULATORY_CARE_PROVIDER_SITE_OTHER): Payer: BC Managed Care – PPO | Admitting: Mental Health

## 2022-03-08 DIAGNOSIS — F322 Major depressive disorder, single episode, severe without psychotic features: Secondary | ICD-10-CM | POA: Diagnosis not present

## 2022-03-08 DIAGNOSIS — F411 Generalized anxiety disorder: Secondary | ICD-10-CM

## 2022-03-08 NOTE — Progress Notes (Signed)
   THERAPIST PROGRESS NOTE  Session Time: 3:35PM ( 55 minutes)   Participation Level: Active  Behavioral Response: CasualAlertDysphoric  Type of Therapy: Individual Therapy  Treatment Goals addressed: STG: Meghan [name] will increase management of anxiety AEB development of x 3 effective coping skills with ability to identify distorted thoughts and reframe daily/as needed within the next 6 months (Anxiety)   ProgressTowards Goals: Initial  Interventions: CBT and Supportive  Summary: Meghan [name] is a 42 y.o. female who presents with dx of major depression severe and generalized anxiety disorder. Meghan [name] presents for session alert and oriented; mood and affect adequate; speech clear and coherent at normal rate and tone. Good eye-contact; pleasant demeanor. Meghan [name] shares for some improvement with sxs sharing thoughts of handing feelings of agitation better with patience not as short. Shares ongoing concerns for anxiety with excessive worry and difficulty falling and staying asleep. Shares overthinking behaviors and able to identify large focus on unrealistic anxieties. Engages with therapist on discussion of distorted thinking styles and identifies frequent catastrophizing, mind reading, personalization among others. Identifies with high degree of frequent negative thinking. Shares ways in which anxiety and low mood has effected level of functioning and not following through on personal goals. 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 Difficulty engaging with others. Agrees to obtain journal and engage in identifying distorted thinking and working to engage in small behavioral changes to engage with small talk with individuals out in community. Denies SI/HI/AVH. Sxs have maintained and persisted; moderate improvement with medications. Initial work on goals with no progress noted.   Suicidal/Homicidal: Nowithout intent/plan  Therapist Response: Therapist engaged  Water quality scientist in therapy session. Completed check in and assessed for current level of functioning, sxs management and stressors. Reviewed informed consent and bounds of confidentiality. Reviewed treatment plan and engaged in rapport building assessing for current sxs and level of functioning. Explored with Meghan [name] ways in which low mood and anxiety has effected life choices. Educated on role of negative thinking and effect on feelings and behaviors. Educated on cognitive distortions. Encouraged Meghan [name] work to increase awareness of negative thinking patterns and engage in journaling to monitor. Explored working to engage in homework to engage in simple small talk with others out in community. Reviewed session and provided follow up appointment. No safety concerns noted.   Plan: Return again in  x 3 weeks.  Diagnosis: Severe major depression, single episode, without psychotic features (Meghan [name])  Generalized anxiety disorder  Collaboration of Care: Other None  Patient/Guardian was advised Release of Information must be obtained prior to any record release in order to collaborate their care with an outside provider. Patient/Guardian was advised if they have not already done so to contact the registration department to sign all necessary forms in order for [place] to release information regarding their care.   Consent: Patient/Guardian gives verbal consent for treatment and assignment of benefits for services provided during this visit. Patient/Guardian expressed understanding and agreed to proceed.   Meghan [name], [facility] 03/08/2022

## 2022-03-09 ENCOUNTER — Encounter (HOSPITAL_COMMUNITY): Payer: Self-pay | Admitting: Student in an Organized Health Care Education/Training Program

## 2022-03-09 ENCOUNTER — Ambulatory Visit (INDEPENDENT_AMBULATORY_CARE_PROVIDER_SITE_OTHER): Payer: BC Managed Care – PPO | Admitting: Student in an Organized Health Care Education/Training Program

## 2022-03-09 VITALS — BP 121/85 | HR 80 | Ht 67.0 in | Wt 179.4 lb

## 2022-03-09 DIAGNOSIS — F411 Generalized anxiety disorder: Secondary | ICD-10-CM | POA: Diagnosis not present

## 2022-03-09 DIAGNOSIS — F5105 Insomnia due to other mental disorder: Secondary | ICD-10-CM | POA: Diagnosis not present

## 2022-03-09 DIAGNOSIS — F322 Major depressive disorder, single episode, severe without psychotic features: Secondary | ICD-10-CM

## 2022-03-09 DIAGNOSIS — F99 Mental disorder, not otherwise specified: Secondary | ICD-10-CM | POA: Diagnosis not present

## 2022-03-09 MED ORDER — SERTRALINE HCL 50 MG PO TABS: 50.0000 mg | ORAL_TABLET | Freq: Every day | ORAL | 1 refills | Status: DC

## 2022-03-09 MED ORDER — TRAZODONE HCL 50 MG PO TABS: 50.0000 mg | ORAL_TABLET | Freq: Every evening | ORAL | 1 refills | Status: DC | PRN

## 2022-03-09 MED ORDER — BUSPIRONE HCL 5 MG PO TABS
5.0000 mg | ORAL_TABLET | Freq: Three times a day (TID) | ORAL | 0 refills | Status: DC
Start: 2022-03-09 — End: 2022-04-20

## 2022-03-09 NOTE — Progress Notes (Signed)
Psychiatric Initial Adult Assessment   Patient Identification: Meghan [name] MRN:  [id number] Date of Evaluation:  03/09/2022 Referral Source: Iona Coach, MD Chief Complaint:   Chief Complaint  Patient presents with   Establish Care   Depression   Anxiety   Visit Diagnosis:    ICD-10-CM   1. Severe major depression, single episode, without psychotic features (HCC)  F32.2 sertraline (ZOLOFT) 50 MG tablet    2. GAD (generalized anxiety disorder)  F41.1 busPIRone (BUSPAR) 5 MG tablet    sertraline (ZOLOFT) 50 MG tablet    3. Insomnia due to other mental disorder  F51.05 traZODone (DESYREL) 50 MG tablet   F99       History of Present Illness:   Meghan [name] is a 42 yr old female who presents to [place] and for Medication Management.  PPHx is significant for Depression and Anxiety, and no Suicide Attempts, Self Injurious Behavior, or Psychiatric Hospitalizations.  She reports that she has been dealing with depression and anxiety for a few years now.  She reports that things became acutely worse in 26-Aug-2022 when her father died.  She reports that after this is when she began to have panic attacks.  She reports that her PCP started her on Lexapro and BuSpar and she has been on them now for approximately 2 months.  She reports that she is always tired and has no motivation to do things.    She reports past psychiatric history significant for depression and anxiety.  She reports no history of suicide attempts.  She reports no history of self-injurious behavior.  She reports no history of psychiatric hospitalizations.  She reports no significant past medical history.  She reports a past surgical history of wisdom tooth removal, no surgery, and left arm surgery.  She reports no history of head trauma or seizures.  When asked about any allergies to medications she reports that when she takes prednisone it does make her lips tingle.  She reports she currently lives in a house with  her son who is 64.  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  She reports she has her GED.  She reports some college.  She reports occasional alcohol use.  She reports vaping nicotine.  Reports very rare THC use.  She reports no current legal issues.  She reports no access to firearms.  She reports that she has felt no improvement since starting the Lexapro.  Discussed trialing Zoloft and she reports that her sister takes it and it has done well for her.  Discussed tapering off the Lexapro by taking 5 mg for 2 days then stopping.  Discussed that given her poor sleep we could also try trazodone.  Discussed potential risks and side effects and she was agreeable to a trial.  Associated Signs/Symptoms: Depression Symptoms:  depressed mood, anhedonia, insomnia, fatigue, feelings of worthlessness/guilt, hopelessness, anxiety, panic attacks, loss of energy/fatigue, disturbed sleep, weight gain, increased appetite, (Hypo) Manic Symptoms:   Reports None Anxiety Symptoms:  Excessive Worry, Panic Symptoms, Psychotic Symptoms:   Reports None PTSD Symptoms: NA  Past Psychiatric History: Depression and Anxiety, and no Suicide Attempts, Self Injurious Behavior, or Psychiatric Hospitalizations.  Previous Psychotropic Medications: Yes  Lexapro, Hydroxyzine, Buspar  Substance Abuse History in the last 12 months:  No.  Consequences of Substance Abuse: NA  Past Medical History: History reviewed. No pertinent past medical history.  Past Surgical History:  Procedure Laterality Date   FRACTURE SURGERY     LACERATION REPAIR  11/28/2011   Procedure: REPAIR MULTIPLE LACERATIONS;  Surgeon: Theodoro Kos, DO;  Location: [facility];  Service: Plastics;  Laterality: N/A;    Family Psychiatric History: Mother- Depression and Anxiety Sister- Depression and Anxiety No Known Substance Abuse or Suicides.  Family History:  Family History  Problem  Relation Age of Onset   Stroke Maternal Aunt    Cancer Mother    Hypertension Mother    COPD Father    Liver disease Father    Diabetes Paternal Grandmother    Heart disease Paternal Grandfather     Social History:   Social History   Socioeconomic History   Marital status: Single    Spouse name: Not on file   Number of children: Not on file   Years of education: Not on file   Highest education level: Not on file  Occupational History   Not on file  Tobacco Use   Smoking status: Every Day    Types: E-cigarettes   Smokeless tobacco: Never  Substance and Sexual Activity   Alcohol use: Yes    Comment: Occasionally   Drug use: No   Sexual activity: Not on file  Other Topics Concern   Not on file  Social History Narrative   Not on file   Social Determinants of Health   Financial Resource Strain: Medium Risk (02/07/2022)   Overall Financial Resource Strain (CARDIA)    Difficulty of Paying Living Expenses: Somewhat hard  Food Insecurity: No Food Insecurity (02/07/2022)   Hunger Vital Sign    Worried About Running Out of Food in the Last Year: Never true    [place] in the Last Year: Never true  Transportation Needs: No Transportation Needs (02/07/2022)   PRAPARE - Hydrologist (Medical): No    Lack of Transportation (Non-Medical): No  Physical Activity: Inactive (02/07/2022)   Exercise Vital Sign    Days of Exercise per Week: 0 days    Minutes of Exercise per Session: 0 min  Stress: Stress Concern Present (02/07/2022)   [place]    Feeling of Stress : Very much  Social Connections: Socially Isolated (02/07/2022)   Social Connection and Isolation Panel [NHANES]    Frequency of Communication with Friends and Family: More than three times a week    Frequency of Social Gatherings with Friends and Family: Never    Attends Religious Services: Never    Marine scientist  or Organizations: No    Attends Archivist Meetings: Never    Marital Status: Never married    Additional Social History: None  Allergies:   Allergies  Allergen Reactions   Shellfish Allergy Swelling    Facial swelling   Bee Venom     Metabolic Disorder Labs: No results found for: "HGBA1C", "MPG" No results found for: "PROLACTIN" No results found for: "CHOL", "TRIG", "HDL", "CHOLHDL", "VLDL", "LDLCALC" No results found for: "TSH"  Therapeutic Level Labs: No results found for: "LITHIUM" No results found for: "CBMZ" No results found for: "VALPROATE"  Current Medications: Current Outpatient Medications  Medication Sig Dispense Refill   sertraline (ZOLOFT) 50 MG tablet Take 1 tablet (50 mg total) by mouth daily. 30 tablet 1   traZODone (DESYREL) 50 MG tablet Take 1 tablet (50 mg total) by mouth at bedtime as needed for sleep. 30 tablet 1   busPIRone (  BUSPAR) 5 MG tablet Take 1 tablet (5 mg total) by mouth 3 (three) times daily. 90 tablet 0   No current facility-administered medications for this visit.    Musculoskeletal: Strength & Muscle Tone: within normal limits Gait & Station: normal Patient leans: N/A  Psychiatric Specialty Exam: Review of Systems  Respiratory:  Negative for shortness of breath.   Cardiovascular:  Negative for chest pain.  Gastrointestinal:  Negative for abdominal pain, constipation, diarrhea, nausea and vomiting.  Neurological:  Negative for dizziness, weakness and headaches.  Psychiatric/Behavioral:  Positive for dysphoric mood and sleep disturbance. Negative for hallucinations and suicidal ideas. The patient is nervous/anxious.     Blood pressure 121/85, pulse 80, height 5\' 7"  (1.702 m), weight 179 lb 6.4 oz (81.4 kg), SpO2 100 %.Body mass index is 28.1 kg/m.  General Appearance: Casual and Fairly Groomed  Eye Contact:  Good  Speech:  Clear and Coherent and Normal Rate  Volume:  Normal  Mood:  Anxious and Dysphoric  Affect:   Congruent  Thought Process:  Coherent and Goal Directed  Orientation:  Full (Time, Place, and Person)  Thought Content:  WDL and Logical  Suicidal Thoughts:  No  Homicidal Thoughts:  No  Memory:  Immediate;   Good Recent;   Good  Judgement:  Good  Insight:  Good  Psychomotor Activity:  Normal  Concentration:  Concentration: Good and Attention Span: Good  Recall:  Good  Fund of Knowledge:Good  Language: Good  Akathisia:  Negative  Handed:  Right  AIMS (if indicated):  not done  Assets:  Communication Skills Desire for Improvement Housing Physical Health Resilience  ADL's:  Intact  Cognition: WNL  Sleep:  Fair   Screenings: AUDIT    Health and safety inspector from 02/07/2022 in [facility]  Alcohol Use Disorder Identification Test Final Score (AUDIT) 7      GAD-7    Flowsheet Row Counselor from 02/07/2022 in [facility] Office Visit from 01/10/2022 in [place]  Total GAD-7 Score 18 19      PHQ2-9    Flowsheet Row Counselor from 02/07/2022 in [facility] Office Visit from 01/10/2022 in [place]  PHQ-2 Total Score 5 5  PHQ-9 Total Score 19 21      Flowsheet Row Counselor from 02/07/2022 in [facility] ED from 09/29/2021 in [place] No Risk No Risk       Assessment and Plan:  Shaleena Sillers is a 42 yr old female who presents to [place] and for Medication Management.  PPHx is significant for Depression and Anxiety, and no Suicide Attempts, Self Injurious Behavior, or Psychiatric Hospitalizations.   Sacheen is continuing to have significant depression and anxiety despite being on the Lexapro for 2 months now.  We will taper off the Lexapro by reducing to 5 mg for 2 days and then starting Zoloft.  We will continue the BuSpar for now.   We will also start as needed trazodone to assist with sleep.  If she continues to have panic attacks may consider switching to propranolol at follow-up appointment.   MDD, single episode, Severe, w/out Psychosis  GAD: -Decrease Lexapro to 5 mg for 2 days then stop. -Start Zoloft 50 mg daily for depression and anxiety.  30 tablets with 1 refill. -Continue Buspar 5 mg TID for anxiety.  90 tablets with 0 refills.  Insomnia: -Start Trazodone 50 mg QHS PRN.  30 tablets with 1 refill.   Collaboration of Care: Other provider involved in patient's care AEB [facility] Therapist  Patient/Guardian was advised Release of Information must be obtained prior to any record release in order to collaborate their care with an outside provider. Patient/Guardian was advised if they have not already done so to contact the registration department to sign all necessary forms in order for [place] to release information regarding their care.   Consent: Patient/Guardian gives verbal consent for treatment and assignment of benefits for services provided during this visit. Patient/Guardian expressed understanding and agreed to proceed.   Briant Cedar, MD 1/5/202412:24 PM

## 2022-03-10 ENCOUNTER — Other Ambulatory Visit: Payer: Self-pay

## 2022-03-10 DIAGNOSIS — F411 Generalized anxiety disorder: Secondary | ICD-10-CM

## 2022-03-21 ENCOUNTER — Ambulatory Visit: Payer: Self-pay | Admitting: Nurse Practitioner

## 2022-04-03 ENCOUNTER — Ambulatory Visit (INDEPENDENT_AMBULATORY_CARE_PROVIDER_SITE_OTHER): Payer: BC Managed Care – PPO | Admitting: Mental Health

## 2022-04-03 DIAGNOSIS — F322 Major depressive disorder, single episode, severe without psychotic features: Secondary | ICD-10-CM | POA: Diagnosis not present

## 2022-04-03 DIAGNOSIS — F411 Generalized anxiety disorder: Secondary | ICD-10-CM

## 2022-04-03 NOTE — Progress Notes (Signed)
   THERAPIST PROGRESS NOTE  Session Time: 3:35pm ( 50 minutes)   Participation Level: Active  Behavioral Response: CasualAlertWNL  Type of Therapy: Individual Therapy  Treatment Goals addressed: STG: Annabelle will increase management of anxiety AEB development of x 3 effective coping skills with ability to identify distorted thoughts and reframe daily/as needed within the next 6 months (Anxiety)    ProgressTowards Goals: Progressing  Interventions: CBT and Supportive  Summary:   EMBERLEY KRAL is a 42 y.o. female who presents with dx of major depression severe and generalized anxiety disorder. Meshawn presents for session alert and oriented; mood and affect adequate; speech clear and coherent at normal rate and tone. Good eye-contact; pleasant demeanor. Shelina shares for some improvement with sxs of depression however minimal improvement with sxs of anxiety. Shares increased awareness of anxious thoughts with difficulty controlling and challenging thoughts. Engaged with therapist with grounding skill to increase management of anxiety. Engaged with therapist in challenging anxious thoughts. Shares desire to request increase in pay at work and shares feelings of anxiety. Identifies difficulty in asking for her needs to be met and will often will attempt to avoid. Shares desire to increase self-care and increase engagement in community as able and expressed distorted thinking related to ability to do so. Shares anxious thoughts related to son's upcoming birthday. Able to process poor boundaries with self and work and often taking work home and answering calls and text after hours with thoughts high in catrastrophizing thoughts. Able to work with therapist and explore ability to engage in requesting for pay raise, increasing community engagement and working to reframe distorted thoughts and practice of [id number] skill. Progress with goals being made; mild sxs improvement. No safety concerns reported.    Suicidal/Homicidal: Nowithout intent/plan  Therapist Response:  Therapist engaged Water quality scientist in therapy session. Completed check in and assessed for current level of functioning, sxs management and stressors. Engaged in processing concerns for anxiety and reviewed presence of realistic vs. Unrealistic anxiety. Explored ability to set appropriate expectations of self and thought process of absolutes and perfection with unrealistic expectations. Explored could vs. Likely and using skills to challenge thinking patterns. Educated and lead through [phone] skill to support in shifting level of focus from internal to external for grounding. Supported in processing ability to ask for for pay increase and working to increase self-care and ability to engage in enjoyable activities. Reviewed session and provided follow up appointment.   Plan: Return again in x 4 weeks.  Diagnosis: Severe major depression, single episode, without psychotic features ([place])  GAD (generalized anxiety disorder)  Collaboration of Care: Other None  Patient/Guardian was advised Release of Information must be obtained prior to any record release in order to collaborate their care with an outside provider. Patient/Guardian was advised if they have not already done so to contact the registration department to sign all necessary forms in order for [place] to release information regarding their care.   Consent: Patient/Guardian gives verbal consent for treatment and assignment of benefits for services provided during this visit. Patient/Guardian expressed understanding and agreed to proceed.   Rockey Situ [place], [facility] 04/03/2022

## 2022-04-20 ENCOUNTER — Ambulatory Visit (INDEPENDENT_AMBULATORY_CARE_PROVIDER_SITE_OTHER): Payer: BC Managed Care – PPO | Admitting: Student in an Organized Health Care Education/Training Program

## 2022-04-20 ENCOUNTER — Encounter (HOSPITAL_COMMUNITY): Payer: Self-pay | Admitting: Student in an Organized Health Care Education/Training Program

## 2022-04-20 DIAGNOSIS — F411 Generalized anxiety disorder: Secondary | ICD-10-CM

## 2022-04-20 DIAGNOSIS — F99 Mental disorder, not otherwise specified: Secondary | ICD-10-CM | POA: Diagnosis not present

## 2022-04-20 DIAGNOSIS — F5105 Insomnia due to other mental disorder: Secondary | ICD-10-CM | POA: Diagnosis not present

## 2022-04-20 DIAGNOSIS — F322 Major depressive disorder, single episode, severe without psychotic features: Secondary | ICD-10-CM | POA: Diagnosis not present

## 2022-04-20 MED ORDER — TRAZODONE HCL 50 MG PO TABS: 50.0000 mg | ORAL_TABLET | Freq: Every day | ORAL | 1 refills | Status: DC

## 2022-04-20 MED ORDER — SERTRALINE HCL 100 MG PO TABS: 100.0000 mg | ORAL_TABLET | Freq: Every day | ORAL | 1 refills | Status: DC

## 2022-04-20 MED ORDER — BUSPIRONE HCL 10 MG PO TABS: 10.0000 mg | ORAL_TABLET | Freq: Three times a day (TID) | ORAL | 1 refills | Status: DC

## 2022-04-20 NOTE — Progress Notes (Signed)
[place] MD/PA/NP OP Progress Note  04/20/2022 12:33 PM Meghan [name]  MRN:  [id number]  Chief Complaint:  Chief Complaint  Patient presents with   Follow-up   Anxiety   Depression   HPI:  Meghan [name] is a 42 yr old female who presents for Follow Up and Medication Management. PPHx is significant for Depression and Anxiety, and no Suicide Attempts, Self Injurious Behavior, or Psychiatric Hospitalizations.    She reports that she has been doing somewhat better since our last appointment.  She reports that her mood is better and that she is able to do things around the house and when she sleeps she feels rested.  She reports her anxiety continues to be fairly significant.  When asked about side effects she reports that during the transition from the Lexapro onto the Zoloft she did have some transitory nausea but that resolved after a few days.  She reports she has had a handful of headaches since her last appointment but they have not been consistent.  Discussed further increasing the Zoloft and BuSpar and she was agreeable to this.  Discussed that we would just scheduled the trazodone since she is taking it nightly.  She reports she has noticed a change in vision but that this has been happening over a long time and is scheduling an appointment to have her eyes checked.  She reports no SI, HI, or AVH.  She reports her sleep is good.  She reports her appetite is good.  She reports no other concerns at present.   Visit Diagnosis:    ICD-10-CM   1. GAD (generalized anxiety disorder)  F41.1 busPIRone (BUSPAR) 10 MG tablet    sertraline (ZOLOFT) 100 MG tablet    2. Severe major depression, single episode, without psychotic features (HCC)  F32.2 sertraline (ZOLOFT) 100 MG tablet    3. Insomnia due to other mental disorder  F51.05 traZODone (DESYREL) 50 MG tablet   F99       Past Psychiatric History: Depression and Anxiety, and no Suicide Attempts, Self Injurious Behavior, or Psychiatric  Hospitalizations.   Past Medical History: No past medical history on file.  Past Surgical History:  Procedure Laterality Date   FRACTURE SURGERY     LACERATION REPAIR  11/28/2011   Procedure: REPAIR MULTIPLE LACERATIONS;  Surgeon: Theodoro Kos, DO;  Location: [place];  Service: Plastics;  Laterality: N/A;    Family Psychiatric History: Mother- Depression and Anxiety Sister- Depression and Anxiety No Known Substance Abuse or Suicides.  Family History:  Family History  Problem Relation Age of Onset   Stroke Maternal Aunt    Cancer Mother    Hypertension Mother    COPD Father    Liver disease Father    Diabetes Paternal Grandmother    Heart disease Paternal Grandfather     Social History:  Social History   Socioeconomic History   Marital status: Single    Spouse name: Not on file   Number of children: Not on file   Years of education: Not on file   Highest education level: Not on file  Occupational History   Not on file  Tobacco Use   Smoking status: Every Day    Types: E-cigarettes   Smokeless tobacco: Never  Substance and Sexual Activity   Alcohol use: Yes    Comment: Occasionally   Drug use: No   Sexual activity: Not on file  Other Topics Concern   Not on file  Social History Narrative  Not on file   Social Determinants of Health   Financial Resource Strain: Medium Risk (02/07/2022)   Overall Financial Resource Strain (CARDIA)    Difficulty of Paying Living Expenses: Somewhat hard  Food Insecurity: No Food Insecurity (02/07/2022)   Hunger Vital Sign    Worried About Running Out of Food in the Last Year: Never true    Ran Out of Food in the Last Year: Never true  Transportation Needs: No Transportation Needs (02/07/2022)   PRAPARE - Hydrologist (Medical): No    Lack of Transportation (Non-Medical): No  Physical Activity: Inactive (02/07/2022)   Exercise Vital Sign    Days of Exercise per Week: 0 days    Minutes of Exercise per  Session: 0 min  Stress: Stress Concern Present (02/07/2022)   [place]    Feeling of Stress : Very much  Social Connections: Socially Isolated (02/07/2022)   Social Connection and Isolation Panel [NHANES]    Frequency of Communication with Friends and Family: More than three times a week    Frequency of Social Gatherings with Friends and Family: Never    Attends Religious Services: Never    Marine scientist or Organizations: No    Attends Archivist Meetings: Never    Marital Status: Never married    Allergies:  Allergies  Allergen Reactions   Shellfish Allergy Swelling    Facial swelling   Bee Venom     Metabolic Disorder Labs: No results found for: "HGBA1C", "MPG" No results found for: "PROLACTIN" No results found for: "CHOL", "TRIG", "HDL", "CHOLHDL", "VLDL", "LDLCALC" No results found for: "TSH"  Therapeutic Level Labs: No results found for: "LITHIUM" No results found for: "VALPROATE" No results found for: "CBMZ"  Current Medications: Current Outpatient Medications  Medication Sig Dispense Refill   busPIRone (BUSPAR) 10 MG tablet Take 1 tablet (10 mg total) by mouth 3 (three) times daily. 90 tablet 1   sertraline (ZOLOFT) 100 MG tablet Take 1 tablet (100 mg total) by mouth daily. 30 tablet 1   traZODone (DESYREL) 50 MG tablet Take 1 tablet (50 mg total) by mouth at bedtime. 30 tablet 1   No current facility-administered medications for this visit.     Musculoskeletal: Strength & Muscle Tone: within normal limits Gait & Station: normal Patient leans: N/A  Psychiatric Specialty Exam: Review of Systems  Respiratory:  Negative for shortness of breath.   Cardiovascular:  Negative for chest pain.  Gastrointestinal:  Negative for abdominal pain, constipation, diarrhea, nausea and vomiting.  Neurological:  Negative for dizziness, weakness and headaches.  Psychiatric/Behavioral:   Positive for dysphoric mood. Negative for hallucinations, sleep disturbance and suicidal ideas. The patient is nervous/anxious.     Blood pressure 134/88, pulse 92, height 5' 7"$  (1.702 m), weight 183 lb 12.8 oz (83.4 kg), SpO2 100 %.Body mass index is 28.79 kg/m.  General Appearance: Casual and Fairly Groomed  Eye Contact:  Good  Speech:  Clear and Coherent and Normal Rate  Volume:  Normal  Mood:  Dysphoric  Affect:  Congruent  Thought Process:  Coherent and Goal Directed  Orientation:  Full (Time, Place, and Person)  Thought Content: WDL and Logical   Suicidal Thoughts:  No  Homicidal Thoughts:  No  Memory:  Immediate;   Good Recent;   Good  Judgement:  Good  Insight:  Good  Psychomotor Activity:  Normal  Concentration:  Concentration: Good and Attention Span:  Good  Recall:  Good  Fund of Knowledge: Good  Language: Good  Akathisia:  Negative  Handed:  Right  AIMS (if indicated): not done  Assets:  Communication Skills Desire for Improvement Housing Physical Health Resilience  ADL's:  Intact  Cognition: WNL  Sleep:  Good   Screenings: AUDIT    Health and safety inspector from 02/07/2022 in [facility]  Alcohol Use Disorder Identification Test Final Score (AUDIT) 7      GAD-7    Flowsheet Row Counselor from 02/07/2022 in [facility] Office Visit from 01/10/2022 in [place]  Total GAD-7 Score 18 19      PHQ2-9    Flowsheet Row Counselor from 02/07/2022 in [facility] Office Visit from 01/10/2022 in [place]  PHQ-2 Total Score 5 5  PHQ-9 Total Score 19 21      Flowsheet Row Counselor from 02/07/2022 in [facility] ED from 09/29/2021 in [facility] Emergency Department at [place] No Risk No Risk        Assessment and Plan:  Orean Daris is a 42 yr old  female who presents for Follow Up and Medication Management. PPHx is significant for Depression and Anxiety, and no Suicide Attempts, Self Injurious Behavior, or Psychiatric Hospitalizations.    Ajanee has had improvement in her symptoms with the starting of the Zoloft.  Anxiety continues to be a significant issue so we will further increase her Zoloft and increase her BuSpar at this time.  Since her sleep has improved with nightly use of trazodone we will switch from as needed to scheduled.  She will return for follow-up in approximately 6 weeks.   MDD, single episode, Severe, w/out Psychosis  GAD: -Increase Zoloft to 100 mg daily for depression and anxiety.  30 tablets with 1 refill. -Increase Buspar to 10 mg TID for anxiety.  90 tablets with 1 refill.     Insomnia: -Change Trazodone 50 mg to scheduled QHS.  30 tablets with 1 refill.   Collaboration of Care: Collaboration of Care: Other provider involved in patient's care AEB [facility] Therapist  Patient/Guardian was advised Release of Information must be obtained prior to any record release in order to collaborate their care with an outside provider. Patient/Guardian was advised if they have not already done so to contact the registration department to sign all necessary forms in order for [place] to release information regarding their care.   Consent: Patient/Guardian gives verbal consent for treatment and assignment of benefits for services provided during this visit. Patient/Guardian expressed understanding and agreed to proceed.    Briant Cedar, MD 04/20/2022, 12:33 PM

## 2022-05-08 ENCOUNTER — Ambulatory Visit (INDEPENDENT_AMBULATORY_CARE_PROVIDER_SITE_OTHER): Payer: BC Managed Care – PPO | Admitting: Mental Health

## 2022-05-08 DIAGNOSIS — F411 Generalized anxiety disorder: Secondary | ICD-10-CM | POA: Diagnosis not present

## 2022-05-08 DIAGNOSIS — F322 Major depressive disorder, single episode, severe without psychotic features: Secondary | ICD-10-CM

## 2022-05-08 NOTE — Progress Notes (Unsigned)
THERAPIST PROGRESS NOTE  Session Time: 3:35 pm ( 51 minutes)   Participation Level: Active  Behavioral Response: CasualAlertAdequate  Type of Therapy: Individual Therapy  Treatment Goals addressed: STG: [place] will increase management of anxiety AEB development of x 3 effective coping skills with ability to identify distorted thoughts and reframe daily/as needed within the next 6 months (Anxiety)     ProgressTowards Goals: Progressing  Interventions: CBT and Supportive  Summary:  Meghan [name] is a 42 y.o. female who presents with dx of major depression severe and generalized anxiety disorder. Meghan [name] presents for session alert and oriented; mood and affect adequate; speech clear and coherent at normal rate and tone. Good eye-contact; pleasant demeanor. Meghan [name] shares for some improvements with ability to engage in increased activity with started to work out this past week. Shares to have noticed benefit in feelings of anxiety and energy levels during the day. Shares has not been able to follow through with requesting raise from employer and shares difficulty of asking for what she needs and wants; noting hx of being a people pleaser. Shares event as a child in which she allowed sister to have a puzzle in which she wanted and cried about it. Notes continues to operate in what it is best for others vs. Her own with identification of core belief of her needs not as important as others. Explored with therapist working to establish boundaries and ability to engage in assertive communication and working to put self first. Explored with therapist working to decrease feelings of anxiety and exploration of likelihood of events and working through worst case scenarios and ability to navigate feelings of being uncomfortable. 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 No  safety concerns noted; denies SI/HI. Progress with goals with identification of coping skills- working out; working to identify maladaptive thinking patterns and exploration of alteratives. Moderate improvement with sxs.   Suicidal/Homicidal: Nowithout intent/plan  Therapist Response: Therapist engaged Water quality scientist in therapy session. Completed check in and assessed for current level of functioning, sxs management and stressors. Engaged in processing concerns for anxiety and reviewed ability to engage in use of coping skills. Explored ability to engage in exercise and explore benefits of engagement on stability of mental health. Engaged Meghan [name] and processing worry of requesting her needs with boss and explored worst case scenario vs likelihood and ability to navigate worse case scenario. Engaged in processing avoidance factor in perpetuating anxiety and working through feelings of discomfort to bring out change. Supported in navigating small steps to increase ability to advocate for self and establish boundaries. Explored core belief held around feelings of importance of self. Reviewed session and provided follow up and homework assignment. No safety concerns reported.   Plan: Return again in  x 4 weeks.  Diagnosis: GAD (generalized anxiety disorder)  Severe major depression, single episode, without psychotic features (Meghan [name])  Collaboration of Care: Other None  Patient/Guardian was advised Release of Information must be obtained prior to any record release in order to collaborate their care with an outside provider. Patient/Guardian was advised if they have not already done so to contact the registration department to sign all necessary forms in order for [place] to release information regarding their care.   Consent: Patient/Guardian gives verbal consent for treatment and assignment of benefits for services provided during this visit. Patient/Guardian expressed understanding and agreed to proceed.   [name],  [facility] 05/08/2022

## 2022-05-24 ENCOUNTER — Ambulatory Visit (INDEPENDENT_AMBULATORY_CARE_PROVIDER_SITE_OTHER): Payer: BC Managed Care – PPO | Admitting: Student in an Organized Health Care Education/Training Program

## 2022-05-24 ENCOUNTER — Encounter (HOSPITAL_COMMUNITY): Payer: Self-pay | Admitting: Student in an Organized Health Care Education/Training Program

## 2022-05-24 DIAGNOSIS — F99 Mental disorder, not otherwise specified: Secondary | ICD-10-CM | POA: Diagnosis not present

## 2022-05-24 DIAGNOSIS — F5105 Insomnia due to other mental disorder: Secondary | ICD-10-CM

## 2022-05-24 DIAGNOSIS — F322 Major depressive disorder, single episode, severe without psychotic features: Secondary | ICD-10-CM

## 2022-05-24 DIAGNOSIS — F411 Generalized anxiety disorder: Secondary | ICD-10-CM | POA: Diagnosis not present

## 2022-05-24 MED ORDER — BUSPIRONE HCL 10 MG PO TABS: 10.0000 mg | ORAL_TABLET | Freq: Three times a day (TID) | ORAL | 1 refills | Status: DC

## 2022-05-24 MED ORDER — TRAZODONE HCL 100 MG PO TABS: 100.0000 mg | ORAL_TABLET | Freq: Every day | ORAL | 1 refills | Status: DC

## 2022-05-24 MED ORDER — SERTRALINE HCL 100 MG PO TABS: 150.0000 mg | ORAL_TABLET | Freq: Every day | ORAL | 1 refills | Status: DC

## 2022-05-24 NOTE — Progress Notes (Signed)
[place] MD/PA/NP OP Progress Note  05/24/2022 2:21 PM Meghan DENNEHY  MRN:  [id number]  Chief Complaint:  Chief Complaint  Patient presents with   Follow-up   Anxiety   Depression   HPI:  Meghan [name] is a 42 yr old female who presents for Follow Up and Medication Management. PPHx is significant for Depression and Anxiety, and no Suicide Attempts, Self Injurious Behavior, or Psychiatric Hospitalizations.   She reports that there has not been much difference or change since her last appointment.  She reports that with the increase in Zoloft and BuSpar she did not notice any further improvements but that she also did not have any side effects.  She reports that she is still able to do more around the house but that there has been no further improvement since starting Zoloft.  She reports that when she gets to sleep her sleep is better but she does have issues getting to sleep.  She reports that she continues to have racing thoughts at night and so if she misses that window of tiredness she will stay up very late.  Discussed with her that since she did have some improvement with starting the medicines but no side effects with the increase is made last time it would be worth trialing an additional increase at this time and she was agreeable with this.  Discussed that if she continues to have no further response we would have to consider medication changes at her follow-up appointment and she reported understanding.  She reports no SI, HI, or AVH.  She reports her sleep is fair.  She reports her appetite is good.  She reports some knee pain but otherwise reports no other concerns at present.  She will return for follow-up in approximately 6 weeks.   Visit Diagnosis:    ICD-10-CM   1. GAD (generalized anxiety disorder)  F41.1 busPIRone (BUSPAR) 10 MG tablet    sertraline (ZOLOFT) 100 MG tablet    2. Severe major depression, single episode, without psychotic features (HCC)  F32.2 sertraline  (ZOLOFT) 100 MG tablet    3. Insomnia due to other mental disorder  F51.05 traZODone (DESYREL) 100 MG tablet   F99       Past Psychiatric History: Mother- Depression and Anxiety Sister- Depression and Anxiety No Known Substance Abuse or Suicides.  Past Medical History: No past medical history on file.  Past Surgical History:  Procedure Laterality Date   FRACTURE SURGERY     LACERATION REPAIR  11/28/2011   Procedure: REPAIR MULTIPLE LACERATIONS;  Surgeon: Theodoro Kos, DO;  Location: [place];  Service: Plastics;  Laterality: N/A;    Family Psychiatric History: Depression and Anxiety, and no Suicide Attempts, Self Injurious Behavior, or Psychiatric Hospitalizations.   Family History:  Family History  Problem Relation Age of Onset   Stroke Maternal Aunt    Cancer Mother    Hypertension Mother    COPD Father    Liver disease Father    Diabetes Paternal Grandmother    Heart disease Paternal Grandfather     Social History:  Social History   Socioeconomic History   Marital status: Single    Spouse name: Not on file   Number of children: Not on file   Years of education: Not on file   Highest education level: Not on file  Occupational History   Not on file  Tobacco Use   Smoking status: Every Day    Types: E-cigarettes   Smokeless tobacco: Never  Substance and Sexual Activity   Alcohol use: Yes    Comment: Occasionally   Drug use: No   Sexual activity: Not on file  Other Topics Concern   Not on file  Social History Narrative   Not on file   Social Determinants of Health   Financial Resource Strain: Medium Risk (02/07/2022)   Overall Financial Resource Strain (CARDIA)    Difficulty of Paying Living Expenses: Somewhat hard  Food Insecurity: No Food Insecurity (02/07/2022)   Hunger Vital Sign    Worried About Running Out of Food in the Last Year: Never true    Ran Out of Food in the Last Year: Never true  Transportation Needs: No Transportation Needs (02/07/2022)    PRAPARE - Hydrologist (Medical): No    Lack of Transportation (Non-Medical): No  Physical Activity: Inactive (02/07/2022)   Exercise Vital Sign    Days of Exercise per Week: 0 days    Minutes of Exercise per Session: 0 min  Stress: Stress Concern Present (02/07/2022)   [place]    Feeling of Stress : Very much  Social Connections: Socially Isolated (02/07/2022)   Social Connection and Isolation Panel [NHANES]    Frequency of Communication with Friends and Family: More than three times a week    Frequency of Social Gatherings with Friends and Family: Never    Attends Religious Services: Never    Marine scientist or Organizations: No    Attends Archivist Meetings: Never    Marital Status: Never married    Allergies:  Allergies  Allergen Reactions   Shellfish Allergy Swelling    Facial swelling   Bee Venom     Metabolic Disorder Labs: No results found for: "HGBA1C", "MPG" No results found for: "PROLACTIN" No results found for: "CHOL", "TRIG", "HDL", "CHOLHDL", "VLDL", "LDLCALC" No results found for: "TSH"  Therapeutic Level Labs: No results found for: "LITHIUM" No results found for: "VALPROATE" No results found for: "CBMZ"  Current Medications: Current Outpatient Medications  Medication Sig Dispense Refill   busPIRone (BUSPAR) 10 MG tablet Take 1 tablet (10 mg total) by mouth 3 (three) times daily. 90 tablet 1   sertraline (ZOLOFT) 100 MG tablet Take 1.5 tablets (150 mg total) by mouth daily. 45 tablet 1   traZODone (DESYREL) 100 MG tablet Take 1 tablet (100 mg total) by mouth at bedtime. 30 tablet 1   No current facility-administered medications for this visit.     Musculoskeletal: Strength & Muscle Tone: within normal limits Gait & Station: normal Patient leans: N/A  Psychiatric Specialty Exam: Review of Systems  Respiratory:  Negative for shortness of  breath.   Cardiovascular:  Negative for chest pain.  Gastrointestinal:  Negative for abdominal pain, constipation, diarrhea, nausea and vomiting.  Musculoskeletal:        Knee pain  Neurological:  Negative for dizziness, weakness and headaches.  Psychiatric/Behavioral:  Positive for dysphoric mood and sleep disturbance. Negative for hallucinations and suicidal ideas. The patient is nervous/anxious.     Blood pressure 126/89, pulse 76, height 5\' 7"  (1.702 m), weight 176 lb (79.8 kg), SpO2 100 %.Body mass index is 27.57 kg/m.  General Appearance: Casual and Fairly Groomed  Eye Contact:  Good  Speech:  Clear and Coherent and Normal Rate  Volume:  Normal  Mood:  Dysphoric  Affect:  Congruent  Thought Process:  Coherent and Goal Directed  Orientation:  Full (Time, Place,  and Person)  Thought Content: WDL and Logical   Suicidal Thoughts:  No  Homicidal Thoughts:  No  Memory:  Immediate;   Good Recent;   Good  Judgement:  Good  Insight:  Good  Psychomotor Activity:  Normal  Concentration:  Concentration: Good and Attention Span: Good  Recall:  Good  Fund of Knowledge: Good  Language: Good  Akathisia:  Negative  Handed:  Right  AIMS (if indicated): not done  Assets:  Communication Skills Desire for Improvement Housing Physical Health Resilience  ADL's:  Intact  Cognition: WNL  Sleep:  Fair   Screenings: AUDIT    Health and safety inspector from 02/07/2022 in [facility]  Alcohol Use Disorder Identification Test Final Score (AUDIT) 7      GAD-7    Flowsheet Row Counselor from 02/07/2022 in [facility] Office Visit from 01/10/2022 in [place]  Total GAD-7 Score 18 19      PHQ2-9    [place] from 02/07/2022 in [facility] Office Visit from 01/10/2022 in [place]  PHQ-2 Total Score 5 5  PHQ-9 Total Score 19 21       Flowsheet Row Counselor from 02/07/2022 in [facility] ED from 09/29/2021 in [facility] Emergency Department at [place] No Risk No Risk        Assessment and Plan:  Myrth Shann is a 42 yr old female who presents for Follow Up and Medication Management. PPHx is significant for Depression and Anxiety, and no Suicide Attempts, Self Injurious Behavior, or Psychiatric Hospitalizations.    Madie has not noticed any more improvement in her symptoms since our last appointment, but has not had any side effects to the increase in her medications either.  Given she did notice improvements with the Zoloft and trazodone we will trial an increase in both of them at this time.  If her anxiety and sleep do not improve we may need to consider trialing different medications.  Did consider trialing Seroquel given her issues with sleep, anxiety, and since it would augment her Zoloft, however, her EKG done in August did show a prolonged QT so we did not start this at this time.  She will return for follow-up in approximately 6 weeks.   MDD, single episode, Severe, w/out Psychosis  GAD: -Increase Zoloft to 150 mg daily for depression and anxiety.  45 (100 mg) tablets with 1 refill. -Continue Buspar 10 mg TID for anxiety.  90 tablets with 1 refill.    Insomnia: -Increase Trazodone to 100 mg to QHS.  30 tablets with 1 refill.    Collaboration of Care: Collaboration of Care: Other provider involved in patient's care AEB [facility] Therapist  Patient/Guardian was advised Release of Information must be obtained prior to any record release in order to collaborate their care with an outside provider. Patient/Guardian was advised if they have not already done so to contact the registration department to sign all necessary forms in order for [place] to release information regarding their care.   Consent: Patient/Guardian gives verbal consent for  treatment and assignment of benefits for services provided during this visit. Patient/Guardian expressed understanding and agreed to proceed.    Briant Cedar, MD 05/24/2022, 2:21 PM

## 2022-06-12 ENCOUNTER — Ambulatory Visit (INDEPENDENT_AMBULATORY_CARE_PROVIDER_SITE_OTHER): Payer: BC Managed Care – PPO | Admitting: Mental Health

## 2022-06-12 DIAGNOSIS — F411 Generalized anxiety disorder: Secondary | ICD-10-CM | POA: Diagnosis not present

## 2022-06-12 DIAGNOSIS — F322 Major depressive disorder, single episode, severe without psychotic features: Secondary | ICD-10-CM

## 2022-06-12 NOTE — Progress Notes (Signed)
   THERAPIST PROGRESS NOTE  Session Time: 1:32pm ( 55 minutes)   Participation Level: Active  Behavioral Response: CasualAlertDysphoric  Type of Therapy: Individual Therapy  Treatment Goals addressed: STG: [place] will increase management of anxiety AEB development of x 3 effective coping skills with ability to identify distorted thoughts and reframe daily/as needed within the next 6 months (Anxiety)    ProgressTowards Goals: Progressing  Interventions: CBT and Supportive  Summary:  RYLA STOVALL is a 42 y.o. female who presents with dx of major depression severe and generalized anxiety disorder. Alpha presents for session alert and oriented; mood and affect adequate; speech clear and coherent at normal rate and tone. Good eye-contact; adequate demeanor. Giana shares concerns for sxs of depression and anxiety with difficulty with interest in things and low motivation. Not able to exercise at this time due to knee injury. Shares some activity with engagement in the community; presenting to lunch with friends and family. 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 Shares will continue to explore if this is in her best interest to continue to pursue although has not made any attempts in the past. Engaged with therapist and explores presence of negative thinking patterns and ways in which distorted anxious thoughts effect level of activity and poor motivation. Agrees to identify x 1 small tasks a day to support in increasing motivation. Agrees to review and use socratic questioning to support in challenging thoughts. Denies SI/HI. Progress with goals; stability in sxs.    Suicidal/Homicidal: Nowithout intent/plan  Therapist Response: Therapist engaged Research scientist (physical sciences) in therapy session. Completed check in and assessed for current level of functioning, sxs  management and stressors. Provided support and encouragement; validated feelings. Explored engagement with employment and advocating for self. Engaged in processing concerns for anxiety and reviewed ability to engage in use of coping skill for exercise. Explored factors contributing to low mood and feelings of anxiety. Completed and reviewed PHQ and GAD scores. Explored use of setting small daily tasks/goals to increase attendance to needs in the home as well as increasing planning for things in which she enjoys. Engaged in education of connection of thoughts feelings and behaviors and working to challenge thoughts. Reviewed socratic questioning worksheet and encouraged to complete as needed. Assessed for safety and reviewed session. Provided follow up appointment.   Plan: Return again in x 4 weeks.  Diagnosis: GAD (generalized anxiety disorder)  Severe major depression, single episode, without psychotic features  Collaboration of Care: Other [employer]  Patient/Guardian was advised Release of Information must be obtained prior to any record release in order to collaborate their care with an outside provider. Patient/Guardian was advised if they have not already done so to contact the registration department to sign all necessary forms in order for [place] to release information regarding their care.   Consent: Patient/Guardian gives verbal consent for treatment and assignment of benefits for services provided during this visit. Patient/Guardian expressed understanding and agreed to proceed.   Stephan Minister [place], [facility] 06/12/2022

## 2022-07-04 ENCOUNTER — Telehealth (HOSPITAL_COMMUNITY): Payer: Self-pay | Admitting: Student in an Organized Health Care Education/Training Program

## 2022-07-04 DIAGNOSIS — F322 Major depressive disorder, single episode, severe without psychotic features: Secondary | ICD-10-CM

## 2022-07-04 DIAGNOSIS — F5105 Insomnia due to other mental disorder: Secondary | ICD-10-CM

## 2022-07-04 DIAGNOSIS — F411 Generalized anxiety disorder: Secondary | ICD-10-CM

## 2022-07-05 ENCOUNTER — Encounter (HOSPITAL_COMMUNITY): Payer: BC Managed Care – PPO | Admitting: Student in an Organized Health Care Education/Training Program

## 2022-07-09 MED ORDER — SERTRALINE HCL 100 MG PO TABS: 150.0000 mg | ORAL_TABLET | Freq: Every day | ORAL | 1 refills | Status: DC

## 2022-07-09 MED ORDER — TRAZODONE HCL 100 MG PO TABS: 100.0000 mg | ORAL_TABLET | Freq: Every day | ORAL | 1 refills | Status: DC

## 2022-07-09 MED ORDER — BUSPIRONE HCL 10 MG PO TABS: 10.0000 mg | ORAL_TABLET | Freq: Three times a day (TID) | ORAL | 1 refills | Status: DC

## 2022-07-09 NOTE — Telephone Encounter (Signed)
Received message to bridge patient's medications until follow up appointment as original appointment needed to be moved.  These were sent.   Sent: -Buspar 10 mg TID.  90 tablets with 1 refill. -Zoloft 100 mg daily.  30 tablets with 1 refill. -Trazodone 100 mg QHS.  30 tablets with 1 refill.    Arna Snipe MD Resident

## 2022-07-18 ENCOUNTER — Ambulatory Visit (INDEPENDENT_AMBULATORY_CARE_PROVIDER_SITE_OTHER): Payer: BC Managed Care – PPO | Admitting: Mental Health

## 2022-07-18 DIAGNOSIS — F411 Generalized anxiety disorder: Secondary | ICD-10-CM

## 2022-07-18 DIAGNOSIS — F322 Major depressive disorder, single episode, severe without psychotic features: Secondary | ICD-10-CM

## 2022-07-18 NOTE — Progress Notes (Unsigned)
   THERAPIST PROGRESS NOTE  Session Time: 2:05 ( 45 minutes)  Participation Level: Active  Behavioral Response: CasualAlertEuthymic  Type of Therapy: Individual Therapy  Treatment Goals addressed: STG: [place] will increase management of anxiety AEB development of x 3 effective coping skills with ability to identify distorted thoughts and reframe daily/as needed within the next 6 months (Anxiety)    ProgressTowards Goals: Progressing  Interventions: CBT and Supportive  Summary: BEVERLY RHOADES is a 42 y.o. female who presents with dx of major depression severe and generalized anxiety disorder. Roc[place] presents for session alert and oriented; mood and affect adequate; speech clear and coherent at normal rate and tone. Good eye-contact; adequate demeanor. Gethsemane shares concerns for sxs of depression sharing for no reduction in sxs at this time despite medication increase during last medication management appointment. 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 Shares improvement in anxiety although shares for anxiety to currently be high rating a 10. Shares to have an upcoming trip to [facility] with friends and has high degree of tasks to complete prior to presenting, "anxiety through the roof." Able to engage with therapist and re-evaluate list of items in which she needs to complete prior to going for trip and reports reduction in feelings of anxiety. Engaged with therapist in exploring presence of maladaptive thinking. Shares to often to think ill of self. Agrees to work on restructuring belief system of self and explore new "theme song." Agrees to explore songs and positive affirmation to support in decreasing sxs of depression.   Suicidal/Homicidal: Nowithout intent/plan  Therapist Response: Therapist engaged Research scientist (physical sciences) in therapy session. Completed check in and assessed for  current level of functioning, sxs management and stressors. Provided support and encouragement; validated feelings. Explored factors contributing to feelings of depression and anxiety. Engaged Natina in working to not overwhelm self and identifying ways to reduce current tasks list. Supported Research scientist (physical sciences) in guided discovery to explore alternative thoughts and and ways to process events and working to restructure core belief system of self. Explored coping skills of speaking to self as if she was her own best friend and increasing awareness of internal dialogue. Provided list of positive affirmations to incorporate in day to support in reframing cognitions and balanced internal dialogue. Assessed for safety and provided follow up appointment.   Plan: Return again in  x 4 weeks.  Diagnosis: GAD (generalized anxiety disorder)  Severe major depression, single episode, without psychotic features (HCC)  Collaboration of Care: Other None  Patient/Guardian was advised Release of Information must be obtained prior to any record release in order to collaborate their care with an outside provider. Patient/Guardian was advised if they have not already done so to contact the registration department to sign all necessary forms in order for [place] to release information regarding their care.   Consent: Patient/Guardian gives verbal consent for treatment and assignment of benefits for services provided during this visit. Patient/Guardian expressed understanding and agreed to proceed.   Stephan Minister [place], [facility] 07/19/2022

## 2022-07-26 ENCOUNTER — Ambulatory Visit (INDEPENDENT_AMBULATORY_CARE_PROVIDER_SITE_OTHER): Payer: BC Managed Care – PPO | Admitting: Student in an Organized Health Care Education/Training Program

## 2022-07-26 VITALS — BP 141/93 | HR 82 | Ht 67.0 in | Wt 174.0 lb

## 2022-07-26 DIAGNOSIS — F411 Generalized anxiety disorder: Secondary | ICD-10-CM

## 2022-07-26 DIAGNOSIS — F99 Mental disorder, not otherwise specified: Secondary | ICD-10-CM

## 2022-07-26 DIAGNOSIS — F5105 Insomnia due to other mental disorder: Secondary | ICD-10-CM

## 2022-07-26 DIAGNOSIS — F322 Major depressive disorder, single episode, severe without psychotic features: Secondary | ICD-10-CM | POA: Diagnosis not present

## 2022-07-26 MED ORDER — TRAZODONE HCL 100 MG PO TABS: 100.0000 mg | ORAL_TABLET | Freq: Every day | ORAL | 1 refills | Status: DC

## 2022-07-26 MED ORDER — FLUOXETINE HCL 20 MG PO CAPS: 20.0000 mg | ORAL_CAPSULE | Freq: Every day | ORAL | 1 refills | Status: DC

## 2022-07-26 MED ORDER — BUSPIRONE HCL 10 MG PO TABS: 10.0000 mg | ORAL_TABLET | Freq: Three times a day (TID) | ORAL | 1 refills | Status: DC

## 2022-07-26 MED ORDER — FLUOXETINE HCL 10 MG PO CAPS: 10.0000 mg | ORAL_CAPSULE | Freq: Every day | ORAL | 0 refills | Status: DC

## 2022-07-26 NOTE — Progress Notes (Signed)
BH MD/PA/NP OP Progress Note  07/27/2022 4:26 AM Meghan [name]  MRN:  [id number]  Chief Complaint:  Chief Complaint  Patient presents with   Follow-up   Depression   Anxiety   HPI:  Meghan [name] is a 42 yr old female who presents for Follow Up and Medication Management. PPHx is significant for Depression and Anxiety, and no Suicide Attempts, Self Injurious Behavior, or Psychiatric Hospitalizations.   She reports that she has not had any improvement or change in her symptoms since her last appointment with the increase in Zoloft.  She reports that she has had a side effect start with the increase in the form of night sweats and she reports that these do happen nightly.  She reports she continues to have significant issues with motivation/energy and has trouble getting tasks done around the house.  Discussed tapering off the Zoloft and onto Prozac.  Discussed potential risks and side effects and she was agreeable to the trial.  She reports no SI, HI, or AVH.  She reports her sleep is fair, has trouble going to sleep but once asleep is able to stay asleep.  She reports her appetite is good.  She reports no other concerns at present.  To return for follow-up approximately 4 to 6 weeks.   Discussed with patient that Resident Provider would be transitioning their care to another Resident Provider, Dr. Cyndie Chime, starting July 2024.  She was agreeable to this and had no concerns.   Visit Diagnosis:    ICD-10-CM   1. Severe major depression, single episode, without psychotic features (HCC)  F32.2 FLUoxetine (PROZAC) 10 MG capsule    FLUoxetine (PROZAC) 20 MG capsule    2. GAD (generalized anxiety disorder)  F41.1 FLUoxetine (PROZAC) 10 MG capsule    FLUoxetine (PROZAC) 20 MG capsule    busPIRone (BUSPAR) 10 MG tablet    3. Insomnia due to other mental disorder  F51.05 FLUoxetine (PROZAC) 10 MG capsule   F99 FLUoxetine (PROZAC) 20 MG capsule    traZODone (DESYREL) 100 MG tablet       Past Psychiatric History: Mother- Depression and Anxiety Sister- Depression and Anxiety No Known Substance Abuse or Suicides.  Past Medical History: No past medical history on file.  Past Surgical History:  Procedure Laterality Date   FRACTURE SURGERY     LACERATION REPAIR  11/28/2011   Procedure: REPAIR MULTIPLE LACERATIONS;  Surgeon: Wayland Denis, DO;  Location: [facility];  Service: Plastics;  Laterality: N/A;    Family Psychiatric History: Depression and Anxiety, and no Suicide Attempts, Self Injurious Behavior, or Psychiatric Hospitalizations.   Family History:  Family History  Problem Relation Age of Onset   Stroke Maternal Aunt    Cancer Mother    Hypertension Mother    COPD Father    Liver disease Father    Diabetes Paternal Grandmother    Heart disease Paternal Grandfather     Social History:  Social History   Socioeconomic History   Marital status: Single    Spouse name: Not on file   Number of children: Not on file   Years of education: Not on file   Highest education level: Not on file  Occupational History   Not on file  Tobacco Use   Smoking status: Every Day    Types: E-cigarettes   Smokeless tobacco: Never  Substance and Sexual Activity   Alcohol use: Yes    Comment: Occasionally   Drug use: No   Sexual activity: Not  on file  Other Topics Concern   Not on file  Social History Narrative   Not on file   Social Determinants of Health   Financial Resource Strain: Medium Risk (02/07/2022)   Overall Financial Resource Strain (CARDIA)    Difficulty of Paying Living Expenses: Somewhat hard  Food Insecurity: No Food Insecurity (02/07/2022)   Hunger Vital Sign    Worried About Running Out of Food in the Last Year: Never true    Ran Out of Food in the Last Year: Never true  Transportation Needs: No Transportation Needs (02/07/2022)   PRAPARE - Administrator, Civil Service (Medical): No    Lack of Transportation (Non-Medical): No  Physical  Activity: Inactive (02/07/2022)   Exercise Vital Sign    Days of Exercise per Week: 0 days    Minutes of Exercise per Session: 0 min  Stress: Stress Concern Present (02/07/2022)   Meghan [name] [employer] Occupational Health - Occupational Stress Questionnaire    Feeling of Stress : Very much  Social Connections: Socially Isolated (02/07/2022)   Social Connection and Isolation Panel [NHANES]    Frequency of Communication with Friends and Family: More than three times a week    Frequency of Social Gatherings with Friends and Family: Never    Attends Religious Services: Never    Database administrator or Organizations: No    Attends Banker Meetings: Never    Marital Status: Never married    Allergies:  Allergies  Allergen Reactions   Shellfish Allergy Swelling    Facial swelling   Bee Venom     Metabolic Disorder Labs: No results found for: "HGBA1C", "MPG" No results found for: "PROLACTIN" No results found for: "CHOL", "TRIG", "HDL", "CHOLHDL", "VLDL", "LDLCALC" No results found for: "TSH"  Therapeutic Level Labs: No results found for: "LITHIUM" No results found for: "VALPROATE" No results found for: "CBMZ"  Current Medications: Current Outpatient Medications  Medication Sig Dispense Refill   FLUoxetine (PROZAC) 10 MG capsule Take 1 capsule (10 mg total) by mouth daily. 14 capsule 0   [START ON 08/07/2022] FLUoxetine (PROZAC) 20 MG capsule Take 1 capsule (20 mg total) by mouth daily. 30 capsule 1   busPIRone (BUSPAR) 10 MG tablet Take 1 tablet (10 mg total) by mouth 3 (three) times daily. 90 tablet 1   sertraline (ZOLOFT) 100 MG tablet Take 1.5 tablets (150 mg total) by mouth daily. 45 tablet 1   traZODone (DESYREL) 100 MG tablet Take 1 tablet (100 mg total) by mouth at bedtime. 30 tablet 1   No current facility-administered medications for this visit.     Musculoskeletal: Strength & Muscle Tone: within normal limits Gait & Station: normal Patient leans:  N/A  Psychiatric Specialty Exam: Review of Systems  Constitutional:  Positive for diaphoresis.  Respiratory:  Negative for shortness of breath.   Cardiovascular:  Negative for chest pain.  Gastrointestinal:  Negative for abdominal pain, constipation, diarrhea, nausea and vomiting.  Neurological:  Negative for dizziness, weakness and headaches.  Psychiatric/Behavioral:  Positive for dysphoric mood. Negative for hallucinations, sleep disturbance and suicidal ideas. The patient is not nervous/anxious.     Blood pressure (!) 141/93, pulse 82, height 5\' 7"  (1.702 m), weight 174 lb (78.9 kg), SpO2 99 %.Body mass index is 27.25 kg/m.  General Appearance: Casual and Fairly Groomed  Eye Contact:  Good  Speech:  Clear and Coherent and Normal Rate  Volume:  Normal  Mood:  Dysphoric  Affect:  Congruent  Thought Process:  Coherent and Goal Directed  Orientation:  Full (Time, Place, and Person)  Thought Content: WDL and Logical   Suicidal Thoughts:  No  Homicidal Thoughts:  No  Memory:  Immediate;   Good Recent;   Good  Judgement:  Good  Insight:  Good  Psychomotor Activity:  Normal  Concentration:  Concentration: Good and Attention Span: Good  Recall:  Good  Fund of Knowledge: Good  Language: Good  Akathisia:  Negative  Handed:  Right  AIMS (if indicated): not done  Assets:  Communication Skills Desire for Improvement Housing Physical Health Resilience  ADL's:  Intact  Cognition: WNL  Sleep:  Fair   Screenings: AUDIT    Advertising copywriter from 02/07/2022 in [facility]  Alcohol Use Disorder Identification Test Final Score (AUDIT) 7      GAD-7    Flowsheet Row Counselor from 06/12/2022 in [facility] Counselor from 02/07/2022 in [facility] Office Visit from 01/10/2022 in [facility]  Total GAD-7 Score 12 18 19       PHQ2-9    Flowsheet Row Counselor from  07/18/2022 in [facility] Counselor from 06/12/2022 in [facility] Counselor from 02/07/2022 in [facility] Office Visit from 01/10/2022 in [facility]  PHQ-2 Total Score 4 5 5 5   PHQ-9 Total Score 18 18 19 21       Flowsheet Row Counselor from 02/07/2022 in [facility] ED from 09/29/2021 in [facility] Emergency Department at [facility]  C-SSRS RISK CATEGORY No Risk No Risk        Assessment and Plan:  Gyneth Merrow is a 42 yr old female who presents for Follow Up and Medication Management. PPHx is significant for Depression and Anxiety, and no Suicide Attempts, Self Injurious Behavior, or Psychiatric Hospitalizations.    Rinda has not had any improvement in her symptoms with the increase in Zoloft made last time but has had side effects of night sweats.  We will begin a taper off of Zoloft and onto Prozac given her main complaint is lack of energy/motivation.  We will not make any other changes to her medications at this time.  She will return for follow-up approximately 4 to 6 weeks.  Her BP was elevated today and encouraged her to discuss this with her PCP.  She reports that she is working on this currently.   MDD, single episode, Severe, w/out Psychosis  GAD: -Decrease Zoloft to 100 mg for 4 days then decrease to 50 mg for 4 days then stop. -After stopping Zoloft start Prozac 10 mg daily for 14 days then increase to 20 mg daily depression and anxiety.  14 (10) capsules tablets with 0 refills and 30 (2 mg) capsules with 1 refill. -Continue Buspar 10 mg TID for anxiety.  90 tablets with 1 refill.    Insomnia: -Continue Trazodone 100 mg to QHS.  30 tablets with 1 refill.    Collaboration of Care: Collaboration of Care: Other provider involved in patient's care AEB [facility] Therapist  Patient/Guardian was advised Release of Information  must be obtained prior to any record release in order to collaborate their care with an outside provider. Patient/Guardian was advised if they have not already done so to contact the registration department to sign all necessary forms in order for [place] to release information regarding their care.  Consent: Patient/Guardian gives verbal consent for treatment and assignment of benefits for services provided during this visit. Patient/Guardian expressed understanding and agreed to proceed.    Lauro Franklin, MD 07/27/2022, 4:26 AM

## 2022-07-27 ENCOUNTER — Encounter (HOSPITAL_COMMUNITY): Payer: Self-pay | Admitting: Student in an Organized Health Care Education/Training Program

## 2022-08-23 ENCOUNTER — Ambulatory Visit (INDEPENDENT_AMBULATORY_CARE_PROVIDER_SITE_OTHER): Payer: Medicaid Other | Admitting: Mental Health

## 2022-08-23 DIAGNOSIS — F322 Major depressive disorder, single episode, severe without psychotic features: Secondary | ICD-10-CM | POA: Diagnosis not present

## 2022-08-23 DIAGNOSIS — F411 Generalized anxiety disorder: Secondary | ICD-10-CM

## 2022-08-23 NOTE — Progress Notes (Signed)
   THERAPIST PROGRESS NOTE  Session Time: 4:04pm ( 48 minutes)  Participation Level: Active  Behavioral Response: CasualAlertWNL  Type of Therapy: Individual Therapy  Treatment Goals addressed: STG: Meghan [name] will increase management of anxiety AEB development of x 3 effective coping skills with ability to identify distorted thoughts and reframe daily/as needed within the next 6 months (Anxiety)    ProgressTowards Goals: Progressing  Interventions: CBT and Supportive  Summary: Meghan [name] is a 42 y.o. female who presents with dx of major depression severe and generalized anxiety disorder. Meghan [name] presents for session alert and oriented; mood and affect adequate; speech clear and coherent at normal rate and tone. Good eye-contact; adequate demeanor. Marda shares for there to have been in increase in mood with reported lower depressive sxs however shares to feel as if benefits were temporary with increase in medication. Explored recent waking up earlier and decreased sleep as possible factor. Shares ongoing anxiety and overthinking and reports life at times can be chaotic with difficulty with organization of events and daily needs causing anxiousness. Engaged with therapist and identifies behavioral changes to support in increase of schedule and routine in daily to life to cope and decrease severity of depression and anxiety. Explores sleep routine, writing down daily tasks. Denies to have completed previous provided homework of finding positive affirmations/theme song and shares will complete. Progress with goals, some improvement with sxs noted.   Suicidal/Homicidal: Nowithout intent/plan  Therapist Response:  Therapist engaged Research scientist (physical sciences) in therapy session. Completed check in and assessed for current level of functioning, sxs management and stressors. Provided support and encouragement; validated feelings. Explored factors contributing to feelings of depression and anxiety. Engaged Hanne  in exploration of use of coping skills and behavioral changes previously discussed. Encouraged working to make small behavioral changes and monitoring of internal dialogue. Provided education on sleep hygiene and reducing screen time prior to bed. Explored use of planner and daily limited to do list to support in decreasing in feelings of overwhelm. Reviewed session and provided follow up appointment. Provided homework to follow through with identified changes. No safety concerns reported.   Plan: Return again in x 6 weeks due to availability   Diagnosis: Severe major depression, single episode, without psychotic features (HCC)  GAD (generalized anxiety disorder)  Collaboration of Care: Other None  Patient/Guardian was advised Release of Information must be obtained prior to any record release in order to collaborate their care with an outside provider. Patient/Guardian was advised if they have not already done so to contact the registration department to sign all necessary forms in order for [place] to release information regarding their care.   Consent: Patient/Guardian gives verbal consent for treatment and assignment of benefits for services provided during this visit. Patient/Guardian expressed understanding and agreed to proceed.   Stephan Minister [place], [facility] 08/25/2022

## 2022-08-31 ENCOUNTER — Ambulatory Visit (INDEPENDENT_AMBULATORY_CARE_PROVIDER_SITE_OTHER): Payer: Medicaid Other | Admitting: Student in an Organized Health Care Education/Training Program

## 2022-08-31 ENCOUNTER — Encounter (HOSPITAL_COMMUNITY): Payer: Self-pay | Admitting: Student in an Organized Health Care Education/Training Program

## 2022-08-31 VITALS — BP 137/88 | HR 80 | Ht 67.0 in | Wt 179.6 lb

## 2022-08-31 DIAGNOSIS — F5105 Insomnia due to other mental disorder: Secondary | ICD-10-CM | POA: Diagnosis not present

## 2022-08-31 DIAGNOSIS — F322 Major depressive disorder, single episode, severe without psychotic features: Secondary | ICD-10-CM | POA: Diagnosis not present

## 2022-08-31 DIAGNOSIS — F99 Mental disorder, not otherwise specified: Secondary | ICD-10-CM | POA: Diagnosis not present

## 2022-08-31 DIAGNOSIS — F411 Generalized anxiety disorder: Secondary | ICD-10-CM | POA: Diagnosis not present

## 2022-08-31 MED ORDER — TRAZODONE HCL 100 MG PO TABS: 100.0000 mg | ORAL_TABLET | Freq: Every day | ORAL | 1 refills | Status: DC

## 2022-08-31 MED ORDER — FLUOXETINE HCL 20 MG PO CAPS
20.0000 mg | ORAL_CAPSULE | Freq: Every day | ORAL | 1 refills | Status: DC
Start: 2022-08-31 — End: 2022-11-02

## 2022-08-31 MED ORDER — PROPRANOLOL HCL 10 MG PO TABS: 10.0000 mg | ORAL_TABLET | Freq: Two times a day (BID) | ORAL | 1 refills | Status: DC

## 2022-08-31 NOTE — Progress Notes (Signed)
BH MD/PA/NP OP Progress Note  08/31/2022 1:19 PM Meghan [name]  MRN:  [id number]  Chief Complaint:  Chief Complaint  Patient presents with   Follow-up   Depression   Anxiety   HPI:  Meghan [name] is a 42 yr old female who presents for Follow Up and Medication Management. PPHx is significant for Depression and Anxiety, and no Suicide Attempts, Self Injurious Behavior, or Psychiatric Hospitalizations.   She reports she has had some improvement since her last appointment.  She reports that the transition off the Zoloft and onto Prozac was a little tough.  She reports she did have some brain zaps and brain fog.  She did report some mild nausea during this transition period but it has since resolved.  She does report feeling overall improved, however, she does report that her anxiety continues to be high and has worsened some.  She reports she feels like her BuSpar is not having any benefit.  Discussed with her that we could trial propranolol for her anxiety.  Discussed potential risks side effects she was agreeable with a trial.  Discussed we would not make any other changes to her medication at this time.  She reports no SI, HI, or AVH.  She reports her sleep is good.  She reports her appetite is doing good.  She reports no other concerns at present.   Discussed with patient that Resident Provider would be transitioning their care to another Resident Provider, Dr. Cyndie Chime, starting July 2024.  She was agreeable to this and had no concerns.   Visit Diagnosis:    ICD-10-CM   1. Severe major depression, single episode, without psychotic features (HCC)  F32.2 FLUoxetine (PROZAC) 20 MG capsule    2. GAD (generalized anxiety disorder)  F41.1 FLUoxetine (PROZAC) 20 MG capsule    propranolol (INDERAL) 10 MG tablet    3. Insomnia due to other mental disorder  F51.05 FLUoxetine (PROZAC) 20 MG capsule   F99 traZODone (DESYREL) 100 MG tablet      Past Psychiatric History: Mother- Depression  and Anxiety Sister- Depression and Anxiety No Known Substance Abuse or Suicides.  Past Medical History: No past medical history on file.  Past Surgical History:  Procedure Laterality Date   FRACTURE SURGERY     LACERATION REPAIR  11/28/2011   Procedure: REPAIR MULTIPLE LACERATIONS;  Surgeon: Wayland Denis, DO;  Location: [facility] OR;  Service: Plastics;  Laterality: N/A;    Family Psychiatric History: Depression and Anxiety, and no Suicide Attempts, Self Injurious Behavior, or Psychiatric Hospitalizations.   Family History:  Family History  Problem Relation Age of Onset   Stroke Maternal Aunt    Cancer Mother    Hypertension Mother    COPD Father    Liver disease Father    Diabetes Paternal Grandmother    Heart disease Paternal Grandfather     Social History:  Social History   Socioeconomic History   Marital status: Single    Spouse name: Not on file   Number of children: Not on file   Years of education: Not on file   Highest education level: Not on file  Occupational History   Not on file  Tobacco Use   Smoking status: Every Day    Types: E-cigarettes   Smokeless tobacco: Never  Substance and Sexual Activity   Alcohol use: Yes    Comment: Occasionally   Drug use: No   Sexual activity: Not on file  Other Topics Concern   Not on file  Social History Narrative   Not on file   Social Determinants of Health   Financial Resource Strain: Medium Risk (02/07/2022)   Overall Financial Resource Strain (CARDIA)    Difficulty of Paying Living Expenses: Somewhat hard  Food Insecurity: No Food Insecurity (02/07/2022)   Hunger Vital Sign    Worried About Running Out of Food in the Last Year: Never true    Ran Out of Food in the Last Year: Never true  Transportation Needs: No Transportation Needs (02/07/2022)   PRAPARE - Administrator, Civil Service (Medical): No    Lack of Transportation (Non-Medical): No  Physical Activity: Inactive (02/07/2022)   Exercise Vital  Sign    Days of Exercise per Week: 0 days    Minutes of Exercise per Session: 0 min  Stress: Stress Concern Present (02/07/2022)   [employer] of Occupational Health - Occupational Stress Questionnaire    Feeling of Stress : Very much  Social Connections: Socially Isolated (02/07/2022)   Social Connection and Isolation Panel [NHANES]    Frequency of Communication with Friends and Family: More than three times a week    Frequency of Social Gatherings with Friends and Family: Never    Attends Religious Services: Never    Database administrator or Organizations: No    Attends Banker Meetings: Never    Marital Status: Never married    Allergies:  Allergies  Allergen Reactions   Shellfish Allergy Swelling    Facial swelling   Bee Venom     Metabolic Disorder Labs: No results found for: "HGBA1C", "MPG" No results found for: "PROLACTIN" No results found for: "CHOL", "TRIG", "HDL", "CHOLHDL", "VLDL", "LDLCALC" No results found for: "TSH"  Therapeutic Level Labs: No results found for: "LITHIUM" No results found for: "VALPROATE" No results found for: "CBMZ"  Current Medications: Current Outpatient Medications  Medication Sig Dispense Refill   propranolol (INDERAL) 10 MG tablet Take 1 tablet (10 mg total) by mouth 2 (two) times daily. 60 tablet 1   FLUoxetine (PROZAC) 20 MG capsule Take 1 capsule (20 mg total) by mouth daily. 30 capsule 1   traZODone (DESYREL) 100 MG tablet Take 1 tablet (100 mg total) by mouth at bedtime. 30 tablet 1   No current facility-administered medications for this visit.     Musculoskeletal: Strength & Muscle Tone: within normal limits Gait & Station: normal Patient leans: N/A  Psychiatric Specialty Exam: Review of Systems  Respiratory:  Negative for shortness of breath.   Cardiovascular:  Negative for chest pain.  Gastrointestinal:  Negative for abdominal pain, constipation, diarrhea, nausea and vomiting.  Neurological:   Negative for dizziness, weakness and headaches.  Psychiatric/Behavioral:  Negative for decreased concentration, hallucinations, sleep disturbance and suicidal ideas. The patient is nervous/anxious.     Blood pressure 137/88, pulse 80, height 5\' 7"  (1.702 m), weight 179 lb 9.6 oz (81.5 kg), SpO2 100 %.Body mass index is 28.13 kg/m.  General Appearance: Casual and Fairly Groomed  Eye Contact:  Good  Speech:  Clear and Coherent and Normal Rate  Volume:  Normal  Mood:  Dysphoric  Affect:  Congruent  Thought Process:  Coherent and Goal Directed  Orientation:  Full (Time, Place, and Person)  Thought Content: WDL and Logical   Suicidal Thoughts:  No  Homicidal Thoughts:  No  Memory:  Immediate;   Good Recent;   Good  Judgement:  Good  Insight:  Good  Psychomotor Activity:  Normal  Concentration:  Concentration: Good  and Attention Span: Good  Recall:  Good  Fund of Knowledge: Good  Language: Good  Akathisia:  Negative  Handed:  Right  AIMS (if indicated): not done  Assets:  Communication Skills Desire for Improvement Housing Physical Health Resilience  ADL's:  Intact  Cognition: WNL  Sleep:  Good   Screenings: AUDIT    Advertising copywriter from 02/07/2022 in [facility]  Alcohol Use Disorder Identification Test Final Score (AUDIT) 7      GAD-7    Flowsheet Row Counselor from 06/12/2022 in [facility] Counselor from 02/07/2022 in [facility] Office Visit from 01/10/2022 in [facility]  Total GAD-7 Score 12 18 19       PHQ2-9    Flowsheet Row Counselor from 07/18/2022 in [facility] Counselor from 06/12/2022 in [facility] Counselor from 02/07/2022 in [facility] Office Visit from 01/10/2022 in [facility]  PHQ-2 Total Score 4 5 5 5   PHQ-9 Total Score 18 18  19 21       Flowsheet Row Counselor from 02/07/2022 in [facility] ED from 09/29/2021 in [facility] Emergency Department at [facility]  C-SSRS RISK CATEGORY No Risk No Risk        Assessment and Plan:  Ladedra Gallas is a 42 yr old female who presents for Follow Up and Medication Management. PPHx is significant for Depression and Anxiety, and no Suicide Attempts, Self Injurious Behavior, or Psychiatric Hospitalizations.    Cagney has had some improvement with the starting of Prozac.  She has had some worsening of her anxiety.  She does not feel like her BuSpar is helping with her symptoms so we will trial propranolol.  Will not make any other changes to her medications at this time.  She will return for follow-up approximately 1 month.   MDD, single episode, Severe, w/out Psychosis  GAD: -Continue Prozac 20 mg daily depression and anxiety.  30 capsules with 1 refill. -Stop Buspar -Start Propanolol 10 mg BID for anxiety.  60 tablets with 1 refill.    Insomnia: -Continue Trazodone 100 mg to QHS.  30 tablets with 1 refill.    Collaboration of Care: Collaboration of Care: Other provider involved in patient's care AEB [facility] Therapist  Patient/Guardian was advised Release of Information must be obtained prior to any record release in order to collaborate their care with an outside provider. Patient/Guardian was advised if they have not already done so to contact the registration department to sign all necessary forms in order for [place] to release information regarding their care.   Consent: Patient/Guardian gives verbal consent for treatment and assignment of benefits for services provided during this visit. Patient/Guardian expressed understanding and agreed to proceed.    Lauro Franklin, MD 08/31/2022, 1:19 PM

## 2022-09-18 ENCOUNTER — Encounter: Payer: Self-pay | Admitting: Internal Medicine

## 2022-09-18 ENCOUNTER — Ambulatory Visit (INDEPENDENT_AMBULATORY_CARE_PROVIDER_SITE_OTHER): Payer: Medicaid Other | Admitting: Internal Medicine

## 2022-09-18 ENCOUNTER — Telehealth: Payer: Self-pay

## 2022-09-18 VITALS — BP 126/86 | HR 75 | Temp 98.3°F | Wt 179.0 lb

## 2022-09-18 DIAGNOSIS — R42 Dizziness and giddiness: Secondary | ICD-10-CM | POA: Insufficient documentation

## 2022-09-18 DIAGNOSIS — K219 Gastro-esophageal reflux disease without esophagitis: Secondary | ICD-10-CM | POA: Diagnosis not present

## 2022-09-18 DIAGNOSIS — K921 Melena: Secondary | ICD-10-CM | POA: Diagnosis present

## 2022-09-18 MED ORDER — PANTOPRAZOLE SODIUM 40 MG PO TBEC: 40.0000 mg | DELAYED_RELEASE_TABLET | Freq: Every day | ORAL | 3 refills | Status: DC

## 2022-09-18 NOTE — Assessment & Plan Note (Addendum)
Meghan [name] mentioned she has acid reflux and takes TUMS almost everyday which only provides her partial relief. She denied having chest pain, palpitations. She mentioned some SOB but thinks it is due to the heat. She feels fatigued all the time. She has a history of taking omeprazole in the past but she is not currently taking it. In the last week, she has experienced significant acid reflux with esophageal burning.  - Pantoprazole prescribed

## 2022-09-18 NOTE — Patient Instructions (Addendum)
Thank you, Meghan [name] Neither for allowing [place] to provide your care today. Today we discussed the following.    Blood in stool - sending referral to GI  Labs - want to check your blood count Return guidance - if you experience any worsening dizziness or light headedness, multiple episodes of blood in stool then I would want you to go to ED for evaluation.  I have ordered the following medication/changed the following medications:   Stop the following medications: There are no discontinued medications.   Start the following medications: No orders of the defined types were placed in this encounter.   We look forward to seeing you next time. Please call our clinic at [phone] if you have any questions or concerns. The best time to call is Monday-Friday from 9am-4pm, but there is someone available 24/7. If after hours or the weekend, call the main hospital number and ask for the Internal Medicine Resident On-Call. If you need medication refills, please notify your pharmacy one week in advance and they will send [place] a request.   Thank you for trusting me with your care. Wishing you the best!   Rudene Christians, DO [facility]

## 2022-09-18 NOTE — Telephone Encounter (Signed)
Return pt's call who stated last week she had a small amount to blood that she noticed on the toilet paper. And now she has noticed more blood esp in the water of the commode. Red in color. Also stated she's having abd discomfort. Appt given today w/Dr Masters @ 1545 PM.

## 2022-09-18 NOTE — Progress Notes (Deleted)
Blood in stool for last week  No history of anemia

## 2022-09-18 NOTE — Progress Notes (Signed)
This is a Psychologist, occupational Note.  The care of the patient was discussed with Dr. Rudene Christians and the assessment and plan was formulated with their assistance.  Please see their note for official documentation of the patient encounter.   Subjective:   Patient ID: Meghan [name] female   DOB: March 12, 1980 42 y.o.   MRN: [id number]  HPI: Ms.Meghan [name] is a 42 y.o. female with history of GERD, anxiety and depression presenting to the clinic after finding blood in her stool. She closely follows psychiatry and a therapist for management of her anxiety and depression. She has not been taking anything for her acid reflux beside TUMS. Please see assessment and plan for more details of the visit.   No past medical history on file. Current Outpatient Medications  Medication Sig Dispense Refill   pantoprazole (PROTONIX) 40 MG tablet Take 1 tablet (40 mg total) by mouth daily. 30 tablet 3   FLUoxetine (PROZAC) 20 MG capsule Take 1 capsule (20 mg total) by mouth daily. 30 capsule 1   propranolol (INDERAL) 10 MG tablet Take 1 tablet (10 mg total) by mouth 2 (two) times daily. 60 tablet 1   traZODone (DESYREL) 100 MG tablet Take 1 tablet (100 mg total) by mouth at bedtime. 30 tablet 1   No current facility-administered medications for this visit.   Family History  Problem Relation Age of Onset   Stroke Maternal Aunt    Cancer Mother    Hypertension Mother    COPD Father    Liver disease Father    Diabetes Paternal Grandmother    Heart disease Paternal Grandfather    Social History   Socioeconomic History   Marital status: Single    Spouse name: Not on file   Number of children: Not on file   Years of education: Not on file   Highest education level: Not on file  Occupational History   Not on file  Tobacco Use   Smoking status: Every Day    Types: E-cigarettes   Smokeless tobacco: Never  Substance and Sexual Activity   Alcohol use: Yes    Comment: Occasionally   Drug use: No    Sexual activity: Not on file  Other Topics Concern   Not on file  Social History Narrative   Not on file   Social Determinants of Health   Financial Resource Strain: Medium Risk (02/07/2022)   Overall Financial Resource Strain (CARDIA)    Difficulty of Paying Living Expenses: Somewhat hard  Food Insecurity: No Food Insecurity (02/07/2022)   Hunger Vital Sign    Worried About Running Out of Food in the Last Year: Never true    Ran Out of Food in the Last Year: Never true  Transportation Needs: No Transportation Needs (02/07/2022)   PRAPARE - Administrator, Civil Service (Medical): No    Lack of Transportation (Non-Medical): No  Physical Activity: Inactive (02/07/2022)   Exercise Vital Sign    Days of Exercise per Week: 0 days    Minutes of Exercise per Session: 0 min  Stress: Stress Concern Present (02/07/2022)   [employer] of Occupational Health - Occupational Stress Questionnaire    Feeling of Stress : Very much  Social Connections: Socially Isolated (02/07/2022)   Social Connection and Isolation Panel [NHANES]    Frequency of Communication with Friends and Family: More than three times a week    Frequency of Social Gatherings with Friends and Family: Never    Attends  Religious Services: Never    Active Member of Clubs or Organizations: No    Attends Banker Meetings: Never    Marital Status: Never married   Review of Systems: ROS negative except for what is noted on the assessment and plan.  Objective:  Physical Exam: Vitals:   09/18/22 1554  BP: 126/86  Pulse: 75  Temp: 98.3 F (36.8 C)  TempSrc: Oral  SpO2: 99%  Weight: 179 lb (81.2 kg)    General appearance: well appearing Eyes: anicteric sclerae, moist conjunctivae; no lid-lag; PERRLA, tracking appropriately HENT: NCAT; oropharynx, MMM, no mucosal ulcerations; normal hard and soft palate Neck: Trachea midline; FROM, supple, no lymphadenopathy, no JVD Lungs: CTAB, no crackles,  no wheeze, with normal respiratory effort and no intercostal retractions CV: RRR, S1, S2, no MRGs  Abdomen: Soft, non-tender; non-distended, BS present  Extremities: No peripheral edema, radial and DP pulses present bilaterally  Skin: Normal temperature, turgor and texture; no rash Psych: Appropriate affect Neuro: Alert and oriented to person and place, no focal deficit   Assessment & Plan:  Blood in stool [name] presented today with 2 episodes of blood in her stool in the last two weeks. She saw blood on her toilet paper last Friday and saw blood with her stool in the toilet bowl yesterday. She experiences "fullness and pressure" in her stomach, and burning sensation but denies pain, itching, diarrhea or constipation. She has also had two episodes of vomiting. She endorses nausea and dizziness in the last month which she contributes to her medication trazodone. She is "always tired." She has been following with psychiatry closely. She denied any recent travel, eating anything new including sea foods.  She has a family history of colon cancer in her father (dx: age 25) and bladder cancer in her mother which is worrisome to her. Her mother was initially misdiagnosed with diverticulosis.  On rectal exam, no external or internal hemorrhoids were found. Orthostatic blood pressure was negative.  - Referral to GI  - CBC order - Return guidance   GERD without esophagitis [name] mentioned she has acid reflux and takes TUMS almost everyday which only provides her partial relief. She denied having chest pain, palpitations. She mentioned some SOB but thinks it is due to the heat. She feels fatigued all the time. She has a history of taking omeprazole in the past but she is not currently taking it. In the last week, she has experienced significant acid reflux with esophageal burning.  - Pantoprazole prescribed  Patient discussed with Dr. Heide Spark

## 2022-09-18 NOTE — Telephone Encounter (Signed)
Patient called she stated she is having blood in her stool, we don't have any available appointments I offered the patient an appointment for next week she stated she will need to be seen sooner. Please return patients call.

## 2022-09-18 NOTE — Assessment & Plan Note (Addendum)
Meghan [name] presented today with 2 episodes of blood in her stool in the last two weeks. She saw blood on her toilet paper last Friday and saw blood with her stool in the toilet bowl yesterday. She experiences "fullness and pressure" in her stomach, and burning sensation but denies pain, itching, diarrhea or constipation. She has also had two episodes of vomiting. She endorses nausea and dizziness in the last month which she contributes to her medication trazodone. She is "always tired." She has been following with psychiatry closely. She denied any recent travel, eating anything new including sea foods.  She has a family history of colon cancer in her father (dx: age 74) and bladder cancer in her mother which is worrisome to her. Her mother was initially misdiagnosed with diverticulosis.  On rectal exam, no external or internal hemorrhoids were found. Orthostatic blood pressure was negative.  - Referral to GI  - CBC order - Return guidance

## 2022-09-19 LAB — CBC
Hematocrit: 36.2 % (ref 34.0–46.6)
Hemoglobin: 12.4 g/dL (ref 11.1–15.9)
MCH: 31.6 pg (ref 26.6–33.0)
MCHC: 34.3 g/dL (ref 31.5–35.7)
MCV: 92 fL (ref 79–97)
Platelets: 244 10*3/uL (ref 150–450)
RBC: 3.93 x10E6/uL (ref 3.77–5.28)
RDW: 13.1 % (ref 11.7–15.4)
WBC: 9 10*3/uL (ref 3.4–10.8)

## 2022-09-19 NOTE — Progress Notes (Signed)
 Internal Medicine Clinic Attending  Case discussed with the resident at the time of the visit.  We reviewed the resident's history and exam and pertinent patient test results.  I agree with the assessment, diagnosis, and plan of care documented in the resident's note.  

## 2022-09-20 ENCOUNTER — Telehealth: Payer: Self-pay

## 2022-09-20 NOTE — Telephone Encounter (Signed)
Patient called she stated she called [facility] and they are booked out until October, patient is requesting a practice with a sooner appt.

## 2022-09-26 ENCOUNTER — Telehealth (HOSPITAL_COMMUNITY): Payer: Self-pay | Admitting: *Deleted

## 2022-09-26 NOTE — Telephone Encounter (Signed)
Fax received for 90 day prescription request for Propranolol 10mg  and Fluoxetine 20mg . Sent to MD for review.

## 2022-09-27 NOTE — Telephone Encounter (Signed)
No problem, thanks for the update.

## 2022-09-28 ENCOUNTER — Ambulatory Visit (HOSPITAL_COMMUNITY): Payer: Medicaid Other | Admitting: Student

## 2022-09-28 DIAGNOSIS — F322 Major depressive disorder, single episode, severe without psychotic features: Secondary | ICD-10-CM

## 2022-09-28 DIAGNOSIS — F17299 Nicotine dependence, other tobacco product, with unspecified nicotine-induced disorders: Secondary | ICD-10-CM

## 2022-09-28 DIAGNOSIS — F411 Generalized anxiety disorder: Secondary | ICD-10-CM

## 2022-10-04 ENCOUNTER — Ambulatory Visit (HOSPITAL_COMMUNITY): Payer: Medicaid Other | Admitting: Mental Health

## 2022-10-25 ENCOUNTER — Ambulatory Visit (HOSPITAL_COMMUNITY): Payer: Medicaid Other | Admitting: Mental Health

## 2022-10-25 ENCOUNTER — Telehealth (HOSPITAL_COMMUNITY): Payer: Self-pay

## 2022-11-02 ENCOUNTER — Telehealth (HOSPITAL_COMMUNITY): Payer: Self-pay | Admitting: Student in an Organized Health Care Education/Training Program

## 2022-11-02 DIAGNOSIS — F322 Major depressive disorder, single episode, severe without psychotic features: Secondary | ICD-10-CM

## 2022-11-02 DIAGNOSIS — F411 Generalized anxiety disorder: Secondary | ICD-10-CM

## 2022-11-02 DIAGNOSIS — F99 Mental disorder, not otherwise specified: Secondary | ICD-10-CM

## 2022-11-02 MED ORDER — PROPRANOLOL HCL 10 MG PO TABS: 10.0000 mg | ORAL_TABLET | Freq: Two times a day (BID) | ORAL | 1 refills | Status: DC

## 2022-11-02 MED ORDER — TRAZODONE HCL 100 MG PO TABS: 100.0000 mg | ORAL_TABLET | Freq: Every day | ORAL | 1 refills | Status: DC

## 2022-11-02 MED ORDER — FLUOXETINE HCL 20 MG PO CAPS: 20.0000 mg | ORAL_CAPSULE | Freq: Every day | ORAL | 1 refills | Status: DC

## 2022-11-02 NOTE — Telephone Encounter (Signed)
Received message that patient missed her establishing appointment with Dr. Cyndie Chime.  She has rescheduled this appointment for 10/11 but will need a refill of her medications.  Will send in a 39-month bridge to ensure she continues on her current medications.   Sent: -Prozac 20 mg daily depression and anxiety.  30 capsules with 1 refill.  -Propanolol 10 mg BID for anxiety.  60 tablets with 1 refill.  -Trazodone 100 mg  QHS.  30 tablets with 1 refill.    Arna Snipe MD Resident

## 2022-12-14 ENCOUNTER — Encounter (HOSPITAL_COMMUNITY): Payer: Medicaid Other | Admitting: Student

## 2023-03-04 NOTE — Telephone Encounter (Signed)
sent 

## 2024-01-24 ENCOUNTER — Telehealth: Payer: Self-pay | Admitting: *Deleted

## 2024-01-24 NOTE — Telephone Encounter (Signed)
 Pt overdue for visit Attempted to contact pt with an appt No answer, message left on recorder No further action needed at this time   Will await call back from pt

## 2024-02-11 ENCOUNTER — Ambulatory Visit: Payer: Self-pay | Admitting: Student

## 2024-03-18 ENCOUNTER — Ambulatory Visit: Payer: Self-pay

## 2024-03-19 ENCOUNTER — Ambulatory Visit: Payer: Self-pay | Admitting: Student

## 2024-03-19 ENCOUNTER — Encounter: Payer: Self-pay | Admitting: Student

## 2024-03-19 ENCOUNTER — Telehealth: Payer: Self-pay

## 2024-03-19 ENCOUNTER — Ambulatory Visit: Admitting: Student

## 2024-03-19 VITALS — BP 150/98 | HR 103 | Ht 67.0 in | Wt 157.6 lb

## 2024-03-19 DIAGNOSIS — N939 Abnormal uterine and vaginal bleeding, unspecified: Secondary | ICD-10-CM

## 2024-03-19 DIAGNOSIS — F322 Major depressive disorder, single episode, severe without psychotic features: Secondary | ICD-10-CM

## 2024-03-19 DIAGNOSIS — R030 Elevated blood-pressure reading, without diagnosis of hypertension: Secondary | ICD-10-CM

## 2024-03-19 DIAGNOSIS — F411 Generalized anxiety disorder: Secondary | ICD-10-CM

## 2024-03-19 DIAGNOSIS — N951 Menopausal and female climacteric states: Secondary | ICD-10-CM | POA: Diagnosis not present

## 2024-03-19 DIAGNOSIS — K219 Gastro-esophageal reflux disease without esophagitis: Secondary | ICD-10-CM

## 2024-03-19 LAB — POCT URINE PREGNANCY: Preg Test, Ur: NEGATIVE

## 2024-03-19 MED ORDER — OMEPRAZOLE MAGNESIUM 20 MG PO TBEC: 20.0000 mg | DELAYED_RELEASE_TABLET | Freq: Every day | ORAL | 1 refills | Status: AC

## 2024-03-19 MED ORDER — VENLAFAXINE HCL ER 37.5 MG PO CP24: ORAL_CAPSULE | ORAL | 0 refills | Status: DC

## 2024-03-19 MED ORDER — PROPRANOLOL HCL 10 MG PO TABS: 10.0000 mg | ORAL_TABLET | Freq: Two times a day (BID) | ORAL | 1 refills | Status: DC

## 2024-03-19 NOTE — Assessment & Plan Note (Signed)
 Patient reports a concern that her body is changing described as more emotional, terrible sleep mood swings, more irritable, irregular over the past 6 to 12 months followed by 3 months of abnormal uterine bleeding including menstrual irregularities in timing and amount of bleeding along with hot flashes.  Patient denies knowing age of mother's menopause.  With patient's changes in menstrual cycle and hot flashes, she is likely perimenopausal versus thyroid disease versus psychosomatic with her significant history of worsening anxiety and depression. Plan: - Urine pregnancy test negative -TSH ordered -Psychiatry referral placed -Venlafaxine  started for both vasomotor symptoms of perimenopause as well as anxiety/depression management

## 2024-03-19 NOTE — Patient Instructions (Signed)
 Thank you, Ms.Powell Meghan [name] for allowing us  to provide your care today. Today we discussed perimenopausal and mood changes.  I will send I referral to psychiatry and start a new medication called venlafaxine . Take one table daily for one week and then tak 2 tablets daily.     I have ordered the following labs for you:   Lab Orders         Pregnancy, urine         TSH       I have ordered the following medication/changed the following medications:   Stop the following medications: Medications Discontinued During This Encounter  Medication Reason   FLUoxetine  (PROZAC ) 20 MG capsule    pantoprazole  (PROTONIX ) 40 MG tablet    propranolol  (INDERAL ) 10 MG tablet    traZODone  (DESYREL ) 100 MG tablet     Follow up: 6 months or sooner as needed   Remember:   Should you have any questions or concerns please call the internal medicine clinic at [phone].     Please note that our late policy has changed.  If you are more than 15 minutes late to your appointment, you may be asked to reschedule your appointment.  Dr. Kandis, D.O. [facility]

## 2024-03-19 NOTE — Progress Notes (Signed)
 "  Established Patient Office Visit  Subjective   Patient ID: Meghan [name], female    DOB: Jul 11, 1980  Age: 44 y.o. MRN: [id number]  Chief Complaint  Patient presents with   Hot Flashes    Pt states she's having perimenopausal-c/o hot flashes, mood swings, spotting between periods, back acne, ear canals itching     Meghan [name] is a 44 y.o. who presents to the clinic for concerns of perimenopausal sx. Please see problem based assessment and plan for additional details.   Patient Active Problem List   Diagnosis Date Noted   Perimenopausal 03/19/2024   Blood in stool 09/18/2022   GAD (generalized anxiety disorder) 02/08/2022   Contraception management 01/10/2022   Elevated blood pressure reading 01/10/2022   Nicotine dependence 11/06/2012   Severe major depression, single episode, without psychotic features (HCC) 11/06/2012   Allergic rhinitis, seasonal 10/16/2012   GERD without esophagitis 10/16/2012   Dog bite of face 11/28/2011      Objective:     BP (!) 150/98 (BP Location: Left Arm, Patient Position: Sitting, Cuff Size: Normal)   Pulse (!) 103   Ht 5' 7 (1.702 m)   Wt 157 lb 9.6 oz (71.5 kg)   SpO2 99%   BMI 24.68 kg/m  BP Readings from Last 3 Encounters:  03/19/24 (!) 150/98  09/18/22 126/86  01/10/22 (!) 134/98   Wt Readings from Last 3 Encounters:  03/19/24 157 lb 9.6 oz (71.5 kg)  09/18/22 179 lb (81.2 kg)  01/10/22 178 lb 9.6 oz (81 kg)      Physical Exam Vitals reviewed.  Constitutional:      General: She is not in acute distress.    Appearance: She is not ill-appearing, toxic-appearing or diaphoretic.  Cardiovascular:     Rate and Rhythm: Regular rhythm. Tachycardia present.  Pulmonary:     Effort: Pulmonary effort is normal.     Breath sounds: Normal breath sounds.  Skin:    General: Skin is warm and dry.  Neurological:     Mental Status: She is alert.  Psychiatric:        Mood and Affect: Mood is anxious and depressed. Affect is  tearful.     Comments: Labile mood Tearful during many portions of the encounter     Last metabolic panel Lab Results  Component Value Date   GLUCOSE 95 09/29/2021   NA 139 09/29/2021   K 3.4 (L) 09/29/2021   CL 108 09/29/2021   CO2 20 (L) 09/29/2021   BUN 11 09/29/2021   CREATININE 0.73 09/29/2021   GFRNONAA >60 09/29/2021   CALCIUM 8.5 (L) 09/29/2021   ANIONGAP 11 09/29/2021      The ASCVD Risk score (Arnett DK, et al., 2019) failed to calculate for the following reasons:   Cannot find a previous HDL lab   Cannot find a previous total cholesterol lab   * - Cholesterol units were assumed    Assessment & Plan:   Problem List Items Addressed This Visit       Digestive   GERD without esophagitis   Refilled Prilosec  20 mg daily      Relevant Medications   omeprazole  (PRILOSEC  OTC) 20 MG tablet     Other   Elevated blood pressure reading   Patient's blood pressure was elevated during office visit at 146/99 rechecked at 150/98.  During this office visit, patient was upset and tearful and crying during numerous points of this visit.  With that being said, I  do not believe that those blood pressures are an accurate reflection of her true blood pressure when she is not upset.  Will not start any medications at this time and will recommend to recheck blood pressure at follow-up whenever she is not crying/upset.      GAD (generalized anxiety disorder)   Patient reports a change in mood swings and worsening anxiety over the past 6 to 12 months.  She feels that she becoming irritable over small things and that her anxiety has worsened during that time.  She contributes these changes to perimenopausal symptoms.  She saw psychiatry last in 2024 and believe that it was not helping her, she has not been taking her Prozac  daily as prior prescribed.  She does still have propranolol  that she takes as needed at home for anxiety.  She denies any SI/HI at this time. PHQ-9 score today is  18.  Plan: -Referral to psychiatry -Refilled Propanolol prn -Start venlafaxine  37.5 mg daily for 1 week followed by 75 mg daily      Relevant Medications   venlafaxine  XR (EFFEXOR  XR) 37.5 MG 24 hr capsule   propranolol  (INDERAL ) 10 MG tablet   Other Relevant Orders   Ambulatory referral to Psychiatry   Perimenopausal   Patient reports a concern that her body is changing described as more emotional, terrible sleep mood swings, more irritable, irregular over the past 6 to 12 months followed by 3 months of abnormal uterine bleeding including menstrual irregularities in timing and amount of bleeding along with hot flashes.  Patient denies knowing age of mother's menopause.  With patient's changes in menstrual cycle and hot flashes, she is likely perimenopausal versus thyroid disease versus psychosomatic with her significant history of worsening anxiety and depression. Plan: - Urine pregnancy test negative -TSH ordered -Psychiatry referral placed -Venlafaxine  started for both vasomotor symptoms of perimenopause as well as anxiety/depression management      Severe major depression, single episode, without psychotic features (HCC)   Relevant Medications   venlafaxine  XR (EFFEXOR  XR) 37.5 MG 24 hr capsule   Other Relevant Orders   Ambulatory referral to Psychiatry   Other Visit Diagnoses       Abnormal uterine bleeding    -  Primary   Relevant Orders   POCT Urine Pregnancy (Completed)   TSH       Return in about 6 months (around 09/16/2024) for BP, mood .    Damien Lease, DO  "

## 2024-03-19 NOTE — Assessment & Plan Note (Signed)
 Patient reports a change in mood swings and worsening anxiety over the past 6 to 12 months.  She feels that she becoming irritable over small things and that her anxiety has worsened during that time.  She contributes these changes to perimenopausal symptoms.  She saw psychiatry last in 2024 and believe that it was not helping her, she has not been taking her Prozac  daily as prior prescribed.  She does still have propranolol  that she takes as needed at home for anxiety.  She denies any SI/HI at this time. PHQ-9 score today is 18.  Plan: -Referral to psychiatry -Refilled Propanolol prn -Start venlafaxine  37.5 mg daily for 1 week followed by 75 mg daily

## 2024-03-19 NOTE — Assessment & Plan Note (Signed)
 Refilled Prilosec 20 mg daily. ?

## 2024-03-19 NOTE — Assessment & Plan Note (Signed)
 Patient's blood pressure was elevated during office visit at 146/99 rechecked at 150/98.  During this office visit, patient was upset and tearful and crying during numerous points of this visit.  With that being said, I do not believe that those blood pressures are an accurate reflection of her true blood pressure when she is not upset.  Will not start any medications at this time and will recommend to recheck blood pressure at follow-up whenever she is not crying/upset.

## 2024-03-19 NOTE — Progress Notes (Signed)
 Internal Medicine Clinic Attending  Case discussed with the resident at the time of the visit.  We reviewed the resident's history and exam and pertinent patient test results.  I agree with the assessment, diagnosis, and plan of care documented in the resident's note.

## 2024-03-19 NOTE — Telephone Encounter (Signed)
 Prior Authorization for patient (Omeprazole  Magnesium  20MG  dr tablets) came through on cover my meds was submitted with last office notes awaiting approval or denial.  XZB:[id number]

## 2024-03-20 LAB — TSH: TSH: 0.671 u[IU]/mL (ref 0.450–4.500)

## 2024-03-23 NOTE — Telephone Encounter (Signed)
 I called the patients insurance and spoke to [facility]. Per Tamika she stated the prior authorization has been approved effective 03/19/2024/-03/14/2025.  PA #:[id number]   Lvm for patient.

## 2024-04-08 ENCOUNTER — Other Ambulatory Visit: Payer: Self-pay | Admitting: Student

## 2024-04-09 ENCOUNTER — Other Ambulatory Visit: Payer: Self-pay | Admitting: Student

## 2024-04-10 ENCOUNTER — Other Ambulatory Visit: Payer: Self-pay | Admitting: Student

## 2024-04-10 DIAGNOSIS — F411 Generalized anxiety disorder: Secondary | ICD-10-CM

## 2024-04-29 ENCOUNTER — Ambulatory Visit (HOSPITAL_COMMUNITY): Admitting: Physician Assistant

## 2024-05-12 ENCOUNTER — Ambulatory Visit (HOSPITAL_COMMUNITY): Payer: Self-pay
